# Patient Record
Sex: Female | Born: 1935 | Race: Black or African American | Hispanic: No | Smoking: Never smoker
Health system: Southern US, Community
[De-identification: ages and names within clinical notes are randomized; demographics above are authoritative.]

## PROBLEM LIST (undated history)

## (undated) DIAGNOSIS — G309 Alzheimer's disease, unspecified: Secondary | ICD-10-CM

## (undated) DIAGNOSIS — N289 Disorder of kidney and ureter, unspecified: Secondary | ICD-10-CM

## (undated) DIAGNOSIS — M1990 Unspecified osteoarthritis, unspecified site: Secondary | ICD-10-CM

## (undated) DIAGNOSIS — I10 Essential (primary) hypertension: Secondary | ICD-10-CM

## (undated) DIAGNOSIS — R296 Repeated falls: Secondary | ICD-10-CM

## (undated) DIAGNOSIS — R569 Unspecified convulsions: Secondary | ICD-10-CM

## (undated) DIAGNOSIS — F0280 Dementia in other diseases classified elsewhere without behavioral disturbance: Secondary | ICD-10-CM

## (undated) HISTORY — DX: Unspecified convulsions: R56.9

## (undated) HISTORY — DX: Disorder of kidney and ureter, unspecified: N28.9

## (undated) HISTORY — DX: Essential (primary) hypertension: I10

## (undated) HISTORY — DX: Repeated falls: R29.6

## (undated) HISTORY — DX: Unspecified osteoarthritis, unspecified site: M19.90

## (undated) HISTORY — PX: TUBAL LIGATION: SHX77

---

## 2005-10-15 LAB — HEPATIC FUNCTION PANEL
ALK PHOS: 78 U/L (ref 25–125)
ALT: 43 U/L — AB (ref 7–35)
AST: 36 U/L — AB (ref 13–35)
Bilirubin, Total: 0.3 mg/dL

## 2005-10-15 LAB — BASIC METABOLIC PANEL
BUN: 10 mg/dL (ref 4–21)
Creatinine: 0.3 mg/dL — AB (ref 0.5–1.1)
Glucose: 92 mg/dL
POTASSIUM: 4.6 mmol/L (ref 3.4–5.3)
SODIUM: 140 mmol/L (ref 137–147)

## 2014-01-03 DIAGNOSIS — E785 Hyperlipidemia, unspecified: Secondary | ICD-10-CM | POA: Insufficient documentation

## 2014-02-06 ENCOUNTER — Emergency Department: Payer: Self-pay | Admitting: Emergency Medicine

## 2014-02-06 LAB — COMPREHENSIVE METABOLIC PANEL
ALBUMIN: 2.7 g/dL — AB (ref 3.4–5.0)
ALK PHOS: 80 U/L
AST: 45 U/L — AB (ref 15–37)
Anion Gap: 5 — ABNORMAL LOW (ref 7–16)
BUN: 15 mg/dL (ref 7–18)
Bilirubin,Total: 0.3 mg/dL (ref 0.2–1.0)
Calcium, Total: 9.3 mg/dL (ref 8.5–10.1)
Chloride: 109 mmol/L — ABNORMAL HIGH (ref 98–107)
Co2: 31 mmol/L (ref 21–32)
Creatinine: 1.07 mg/dL (ref 0.60–1.30)
EGFR (Non-African Amer.): 51 — ABNORMAL LOW
Glucose: 84 mg/dL (ref 65–99)
Osmolality: 289 (ref 275–301)
Potassium: 3.2 mmol/L — ABNORMAL LOW (ref 3.5–5.1)
SGPT (ALT): 30 U/L
Sodium: 145 mmol/L (ref 136–145)
Total Protein: 6.7 g/dL (ref 6.4–8.2)

## 2014-02-06 LAB — CBC
HCT: 36.2 % (ref 35.0–47.0)
HGB: 11.2 g/dL — AB (ref 12.0–16.0)
MCH: 26.6 pg (ref 26.0–34.0)
MCHC: 31 g/dL — AB (ref 32.0–36.0)
MCV: 86 fL (ref 80–100)
Platelet: 252 10*3/uL (ref 150–440)
RBC: 4.21 10*6/uL (ref 3.80–5.20)
RDW: 16.7 % — AB (ref 11.5–14.5)
WBC: 8.1 10*3/uL (ref 3.6–11.0)

## 2014-02-06 LAB — URINALYSIS, COMPLETE
Bacteria: NONE SEEN
Bilirubin,UR: NEGATIVE
GLUCOSE, UR: NEGATIVE mg/dL (ref 0–75)
KETONE: NEGATIVE
NITRITE: NEGATIVE
Ph: 7 (ref 4.5–8.0)
Protein: NEGATIVE
RBC,UR: 64 /HPF (ref 0–5)
SPECIFIC GRAVITY: 1.008 (ref 1.003–1.030)
Squamous Epithelial: <1

## 2014-02-06 LAB — TROPONIN I: TROPONIN-I: 0.07 ng/mL — AB

## 2014-02-07 ENCOUNTER — Observation Stay: Payer: Self-pay | Admitting: Internal Medicine

## 2014-02-07 DIAGNOSIS — I4891 Unspecified atrial fibrillation: Secondary | ICD-10-CM

## 2014-02-07 DIAGNOSIS — R55 Syncope and collapse: Secondary | ICD-10-CM

## 2014-02-07 DIAGNOSIS — I10 Essential (primary) hypertension: Secondary | ICD-10-CM

## 2014-02-07 LAB — MAGNESIUM: Magnesium: 2 mg/dL

## 2014-02-07 LAB — COMPREHENSIVE METABOLIC PANEL
ALBUMIN: 2.7 g/dL — AB (ref 3.4–5.0)
ALT: 34 U/L
Alkaline Phosphatase: 69 U/L
Anion Gap: 6 — ABNORMAL LOW (ref 7–16)
BUN: 12 mg/dL (ref 7–18)
Bilirubin,Total: 0.4 mg/dL (ref 0.2–1.0)
CO2: 29 mmol/L (ref 21–32)
Calcium, Total: 8.9 mg/dL (ref 8.5–10.1)
Chloride: 106 mmol/L (ref 98–107)
Creatinine: 1.01 mg/dL (ref 0.60–1.30)
EGFR (African American): >60
EGFR (Non-African Amer.): 55 — ABNORMAL LOW
GLUCOSE: 98 mg/dL (ref 65–99)
Osmolality: 281 (ref 275–301)
Potassium: 3.3 mmol/L — ABNORMAL LOW (ref 3.5–5.1)
SGOT(AST): 42 U/L — ABNORMAL HIGH (ref 15–37)
SODIUM: 141 mmol/L (ref 136–145)
Total Protein: 6.5 g/dL (ref 6.4–8.2)

## 2014-02-07 LAB — CBC WITH DIFFERENTIAL/PLATELET
BASOS ABS: 0 10*3/uL (ref 0.0–0.1)
Basophil %: 0.6 %
EOS PCT: 1.6 %
Eosinophil #: 0.1 10*3/uL (ref 0.0–0.7)
HCT: 34.9 % — AB (ref 35.0–47.0)
HGB: 11.1 g/dL — ABNORMAL LOW (ref 12.0–16.0)
LYMPHS PCT: 22.7 %
Lymphocyte #: 1.7 10*3/uL (ref 1.0–3.6)
MCH: 27.2 pg (ref 26.0–34.0)
MCHC: 31.7 g/dL — AB (ref 32.0–36.0)
MCV: 86 fL (ref 80–100)
Monocyte #: 0.7 x10 3/mm (ref 0.2–0.9)
Monocyte %: 8.7 %
Neutrophil #: 5 10*3/uL (ref 1.4–6.5)
Neutrophil %: 66.4 %
Platelet: 170 10*3/uL (ref 150–440)
RBC: 4.07 10*6/uL (ref 3.80–5.20)
RDW: 16.8 % — AB (ref 11.5–14.5)
WBC: 7.5 10*3/uL (ref 3.6–11.0)

## 2014-02-07 LAB — TROPONIN I
Troponin-I: 0.06 ng/mL — ABNORMAL HIGH
Troponin-I: 0.05 ng/mL
Troponin-I: 0.05 ng/mL

## 2014-02-07 LAB — TSH: THYROID STIMULATING HORM: 3.42 u[IU]/mL

## 2014-02-08 DIAGNOSIS — I340 Nonrheumatic mitral (valve) insufficiency: Secondary | ICD-10-CM

## 2014-02-08 DIAGNOSIS — I4891 Unspecified atrial fibrillation: Secondary | ICD-10-CM

## 2014-02-08 DIAGNOSIS — R7989 Other specified abnormal findings of blood chemistry: Secondary | ICD-10-CM

## 2014-02-08 DIAGNOSIS — R55 Syncope and collapse: Secondary | ICD-10-CM

## 2014-02-08 LAB — BASIC METABOLIC PANEL
Anion Gap: 5 — ABNORMAL LOW (ref 7–16)
BUN: 10 mg/dL (ref 7–18)
CHLORIDE: 113 mmol/L — AB (ref 98–107)
CO2: 28 mmol/L (ref 21–32)
Calcium, Total: 8.4 mg/dL — ABNORMAL LOW (ref 8.5–10.1)
Creatinine: 0.89 mg/dL (ref 0.60–1.30)
EGFR (African American): >60
GLUCOSE: 82 mg/dL (ref 65–99)
OSMOLALITY: 289 (ref 275–301)
POTASSIUM: 4.2 mmol/L (ref 3.5–5.1)
Sodium: 146 mmol/L — ABNORMAL HIGH (ref 136–145)

## 2014-06-10 NOTE — Consult Note (Signed)
General Aspect Primary Cardiologist: New to [facility] ________________  81 year old female with history of Alzheimer's, anorexia, HTN, dementia, and diverticulosis who presented to [facility] on 02/06/14 after suffering a presyncopal/syncopal episode at the nurising home. Upon her arrival to the ED she was found to be in new onset a-fib with heart rate in the 60s. Troponin was found to be 0.07 with subsequent levels 0.06-->0.05. Head CT negative for acute intracranial abnormality. Cardiology was consulted for further evaluation of her new onset a-fib and presyncopal/syncopal episode.  _______________   PMH: 1. Alzehimer's disease 2. Anorexia 3. HTN 4. Dementia 5. Diverticulosis _______________   Present Illness 81 year old female with the above problem list who presented to [facility] on 12/21 with complaints after sustaining a presyncopal/syncopal episode at her nurising home.   Patient's history is taken from her family member's (2 daughters and 1 son in the room).  She is without any previously known cardiac history. No previous echos, cardiac stress tests, or catheterizations. She does not have a cardiologist. She was recently diagnosed with a "severe UTI" and is finishing up treatment for this with antibiotics. She has also been diagnosed with anorexia and has lost a considerable amount of weight. She has been suffering an increased number of falls lately and these seem to be increasing in frequency.   At her nursing home on 12/21 she was sitting in a chair and slumped over all of the sudden. Her children were not told of any slurred speech or facial droop. The patient was unable to stand at that time and it took 2 workers to carry her to a bed for further evaluation. Her children are unable to tell me how long these symptoms lasted. She has never had an episode like this before. The patient was brought to [facility] for further evaluation. Upon her arrival to [facility] she was found to be in rate controlled a-fib  with a rate in the 60s. Apparently, this is a new diagnosis for the patient as her children are not aware of the patient being diagnosed with a-fib previously. She remained in a-fib, rate controlled, until arriving on the floor when she converted to sinus rhythm with frequent PACs and blocked PACs. Labs were significant for K+ 3.2-->3.3, TSH 3.42, troponin 0.07-->0.06-->0.05, hgb 11.2, CT as above, CXR with hypoinflation without acute cardiopulmonary disease, UA with 2+ blood and trace LE. She is currently resting comfortably in the bed with headphones on.   Physical Exam:  GEN well developed, no acute distress, thin   HEENT PERRL, HOH   NECK supple   RESP normal resp effort  clear BS   CARD Irregular rate and rhythm  Normal, S1, S2  No murmur   ABD denies tenderness  soft   LYMPH negative neck   EXTR negative edema   SKIN normal to palpation   NEURO motor/sensory function intact   PSYCH alert   Review of Systems:  Subjective/Chief Complaint SOB   General: Fatigue  Weakness   Skin: No Complaints   ENT: Decreased hearing   Eyes: No Complaints   Neck: No Complaints   Respiratory: Short of breath   Cardiovascular: Palpitations  Dyspnea   Gastrointestinal: No Complaints   Genitourinary: No Complaints   Vascular: No Complaints   Musculoskeletal: No Complaints   Neurologic: Dizzness  Fainting   Hematologic: No Complaints   Endocrine: No Complaints   Psychiatric: No Complaints   Review of Systems: All other systems were reviewed and found to be  negative   Family & Social History:  Family and Social History:  Family History Negative  unable to obtain 2/2 dementia   Social History negative tobacco, negative ETOH, negative Illicit drugs   Place of Living Nursing Home     Alzheimer's Disease:    alz:    diverticulitis:    hypertension:          Admit Diagnosis:   SYNCOPE: Onset Date: 07-Feb-2014, Status: Active, Description: SYNCOPE  Home  Medications: Medication Instructions Status  ciprofloxacin 500 mg oral tablet 1 tab(s) orally every 12 hours Active  guaiFENesin 100 mg/5 mL oral liquid 10 milliliter(s) orally every 6 hours, As Needed Active  loperamide 2 mg oral tablet 1 tab(s) orally , As Needed after each loose stool. maximum 4 doses per day Active  Pepto-Bismol 525 mg/15 mL oral suspension 15 milliliter(s) orally every 2 hours, As Needed (max of 6 doses in 24 hours) Active  Milk of Magnesia 8% oral suspension 30 milliliter(s) orally once a day (at bedtime), As Needed Active  acetaminophen 325 mg oral tablet 2 tab(s) orally every 4 hours, As Needed Active  Mylanta Maximum Strength 400 mg-400 mg-40 mg/5 mL oral suspension 30 milliliter(s) orally 4 times a day (before meals and at bedtime), As Needed Active  Aspirin Enteric Coated 81 mg oral delayed release tablet 1 tab(s) orally once a day Active  donepezil 10 mg oral tablet 1 tab(s) orally once a day (at bedtime) Active  losartan 50 mg oral tablet 1 tab(s) orally once a day Active  multivitamin 1 tab(s) orally once a day Active  Vitamin D3 5000 intl units oral tablet 1 tab(s) orally once a day Active  mirtazapine 15 mg oral tablet 1 tab(s) orally once a day (at bedtime) Active   Lab Results:  Thyroid:  22-Dec-15 08:59   Thyroid Stimulating Hormone 3.42 (0.45-4.50 (IU = International Unit)  ----------------------- Pregnant patients have  different reference  ranges for TSH:  - - - - - - - - - -  Pregnant, first trimetser:  0.36 - 2.50 uIU/mL)  Hepatic:  22-Dec-15 08:52   Bilirubin, Total 0.4  Alkaline Phosphatase 69 (46-116 NOTE: New Reference Range 09/06/13)  SGPT (ALT) 34 (14-63 NOTE: New Reference Range 09/06/13)  SGOT (AST)  42  Total Protein, Serum 6.5  Albumin, Serum  2.7  Routine Chem:  22-Dec-15 08:52   Result Comment PLATELETS - SLIGHT PLATELET CLUMPING IN SPECIMEN. ACTUAL  - NUMERICAL COUNT MAY BE SOMEWHAT HIGHER THAN  - THE REPORTED VALUE.   Result(s) reported on 07 Feb 2014 at 10:05AM.  Glucose, Serum 98  BUN 12  Creatinine (comp) 1.01  Sodium, Serum 141  Potassium, Serum  3.3  Chloride, Serum 106  CO2, Serum 29  Calcium (Total), Serum 8.9  Osmolality (calc) 281  eGFR (African American) >60  eGFR (Non-African American)  55 (eGFR values <2m/min/1.73 m2 may be an indication of chronic kidney disease (CKD). Calculated eGFR, using the MRDR Study equation, is useful in  patients with stable renal function. The eGFR calculation will not be reliable in acutely ill patients when serum creatinine is changing rapidly. It is not useful in patients on dialysis. The eGFR calculation may not be applicable to patients at the low and high extremes of body sizes, pregnant women, and vegetarians.)  Anion Gap  6  Cardiac:  22-Dec-15 08:52   Troponin I  0.06 (0.00-0.05 0.05 ng/mL or less: NEGATIVE  Repeat testing in 3-6 hrs  if clinically indicated. >0.05 ng/mL:  POTENTIAL  MYOCARDIAL INJURY. Repeat  testing in 3-6 hrs if  clinically indicated. NOTE: An increase or decrease  of 30% or more on serial  testing suggests a  clinically important change)    13:20   Troponin I 0.05 (0.00-0.05 0.05 ng/mL or less: NEGATIVE  Repeat testing in 3-6 hrs  if clinically indicated. >0.05 ng/mL: POTENTIAL  MYOCARDIAL INJURY. Repeat  testing in 3-6 hrs if  clinically indicated. NOTE: An increase or decrease  of 30% or more on serial  testing suggests a  clinically important change)  Routine Hem:  22-Dec-15 08:52   WBC (CBC) 7.5  RBC (CBC) 4.07  Hemoglobin (CBC)  11.1  Hematocrit (CBC)  34.9  Platelet Count (CBC) 170  MCV 86  MCH 27.2  MCHC  31.7  RDW  16.8  Neutrophil % 66.4  Lymphocyte % 22.7  Monocyte % 8.7  Eosinophil % 1.6  Basophil % 0.6  Neutrophil # 5.0  Lymphocyte # 1.7  Monocyte # 0.7  Eosinophil # 0.1  Basophil # 0.0   EKG:  EKG Interp. by me   Interpretation EKG shows a-fib, 67 bpm, left anterior  fascicular block, no st/t changes   Radiology Results:  XRay:    22-Dec-15 09:12, Chest PA and Lateral  Chest PA and Lateral   REASON FOR EXAM:    fall  COMMENTS:       PROCEDURE: DXR - DXR CHEST PA (OR AP) AND LATERAL  - Feb 07 2014  9:12AM     CLINICAL DATA:  Unwitnessed fall this morning. Patient nonverbal and  unable to communicate. Alzheimer's disease.    EXAM:  CHEST  2 VIEW    COMPARISON:  None.    FINDINGS:  Lungs are hypoinflated but otherwise clear. There is mild  cardiomegaly as well as mild calcified plaque over the aortic arch.  There are mild to moderate degenerative changes of the spine with  mild curvature of the thoracic spine convex right. There is mild  anterior wedging of a couple upper thoracic vertebral bodies.     IMPRESSION:  Hypoinflation without acute cardiopulmonary disease.    Mild anterior wedging of a couple upper thoracic vertebral bodies  likely chronic.      Electronically Signed    By: Marin Olp M.D.    On: 02/07/2014 09:19     Verified By: Pearletha Alfred, M.D.,    No Known Allergies:   Vital Signs/Nurse's Notes: **Vital Signs.:   22-Dec-15 14:46  Vital Signs Type Admission  Temperature Temperature (F) 98.2  Celsius 36.7  Temperature Source oral  Pulse Pulse 63  Respirations Respirations 20  Systolic BP Systolic BP 371  Diastolic BP (mmHg) Diastolic BP (mmHg) 82  Mean BP 117  Pulse Ox % Pulse Ox % 97  Pulse Ox Activity Level  At rest  Oxygen Delivery Room Air/ 21 %    Impression 81 year old female with history of Alzheimer's, anorexia, HTN, dementia, and diverticulosis who presented to [facility] on 02/06/14 after suffering a presyncopal/syncopal episode at the nurising home. Upon her arrival to the ED she was found to be in new onset a-fib with heart rate in the 60s. Troponin was found to be 0.07 with subsequent levels 0.06-->0.05. Head CT negative for acute intracranial abnormality. Cardiology was consulted for further  evaluation of her new onset a-fib and presyncopal/syncopal episode.   1. New onset a-fib: -Unclear when she went into this rhythm - possible precipitating event could be recent UTI 1  week ago -Rate controlled upon admission with rates in the 60s -Telemetry currently shows sinus rhythm with PACs and blocked PACs with rates in the mid 60s -She has history of falls and has been falling more recently, given this would hold anticoagulation at this time -CHADSVASc at least 4, giving her a 4.0% annual risk of stroke -Check Mg -K+ 3.3 -->replete to goal of 4.0 -TSH ok -Add low dose Lopressor 12.5 mg bid with hold paremeters will work on BP control  2. Presyncopal/syncopal event: -Possible TIA 2/2 the above a-fib -Head CT negative for acute intracranial abnormality Consider MRI brain -Check echo to evaluate LV function and wall motion -Telemetry as above, continue to monitor on tele -If telemetry is unremarkable while inpatient could pursue outpatient cardiac monitoring if family wishes  3. Hypokalemia: -Replete to goal of 4.0  4. Elevated troponin: -Likely demand ischemia in the setting of her new onset a-fib -Mildly elevated troponin with a peak of 0.07 that trended down to 0.06-->0.05 -Echo as above no stress test at this time  4. HTN: -Uncontrolled (189/82) -Losartan 50 mg -Lopressor added per above add amlodipine  5. Dementia  6. Anorexia  7. History of UTI: -UA without evidence of infection at this time   Electronic Signatures: Rise Mu (PA-C)  (Signed 22-Dec-15 18:08)  Authored: General Aspect/Present Illness, History and Physical Exam, Review of System, Family & Social History, Past Medical History, Home Medications, Labs, EKG , Radiology, Allergies, Vital Signs/Nurse's Notes, Impression/Plan Ida Rogue (MD)  (Signed 22-Dec-15 19:53)  Authored: General Aspect/Present Illness, History and Physical Exam, Review of System, Family & Social History, Health Issues,  EKG , Radiology, Vital Signs/Nurse's Notes, Impression/Plan  Co-Signer: General Aspect/Present Illness, Home Medications, Allergies   Last Updated: 22-Dec-15 19:53 by Ida Rogue (MD)

## 2014-06-10 NOTE — H&P (Signed)
PATIENT NAME:  Mary [name], Mary [name] MR#:  [id number] DATE OF BIRTH:  [birth year]  DATE OF ADMISSION:  02/07/2014   CHIEF COMPLAINT: Fall.   HISTORY OF PRESENT ILLNESS: A 81 year old [place] female patient with Alzheimer dementia, hypertension, recurrent falls, presents to the hospital after she was found to be fallen on the floor.  It is unclear if the patient had any syncope. The patient with Alzheimer's dementia is a poor historian and presently she has no concerns and is very pleasant.   The patient has been found to have elevated troponin of 0.07 along with new onset atrial fibrillation on her EKG and is being admitted to the hospitalist service for further work-up and treatment.   History has been obtained from old records, ER staff, and talking to the family.   The patient is a resident of a care home. He has a 24-hour caregiver with her.  She tends to ambulate on her own, but has had recurrent falls over a long time due to balance problems. Today the fall was unwitnessed. The patient had a CT scan of the head and neck, which showed some arthritis, nothing acute.   PAST MEDICAL HISTORY:  1.  Alzheimer's dementia.  2.  Hypertension.  3.  Recurrent falls.   SOCIAL HISTORY: The patient does not smoke. No alcohol. No illicit drug use.   CODE STATUS: FULL CODE.   FAMILY HISTORY: Reviewed, but unknown.   REVIEW OF SYSTEMS: Unobtainable secondary to the patient's dementia.   ALLERGIES: No drug allergies.   HOME MEDICATIONS:  1.  Losartan 50 mg daily.  2.  Aspirin 81 mg daily. 3.  Acetaminophen 325, 2 tablets every 6 hours as needed.  4.  Ciprofloxacin 5 mg every 12 hours, started recently for urinary tract infection.  6.  Loperamide 2 mg oral as needed for diarrhea.  7.  Remeron 15 mg oral once a day.  8.  Multivitamin 1 tablet daily.  9.  Mylanta 30 mL oral 4 times a day as needed.  10. Vitamin D3 5000 international units once a day.   PHYSICAL EXAMINATION:  VITAL SIGNS:  Temperature 97.6, pulse of 64 and regular, blood pressure 155/72 and saturating 97% on room air.  GENERAL: Frail, elderly African American female patient lying in bed, overall seems comfortable.  PSYCHIATRIC:  Alert, awake, not oriented to place or time, oriented to person. Pleasant. HEENT:  Atraumatic, normocephalic.  Oral mucosa moist and pink. External ears and nose normal. No pallor or icterus. Pupils are equal and reactive to light.  NECK: Supple. No thyromegaly.  No palpable lymph nodes.  Trachea midline. No carotid bruit, JVD.  CARDIOVASCULAR: S1, S2, irregular. No murmurs. Peripheral pulses 2+. No edema.  RESPIRATORY: Normal work of breathing. Clear to auscultation on both sides.  GASTROINTESTINAL: Soft abdomen, nontender, bowel sounds present,no organomegaly palpable.  SKIN: Warm and dry, no petechiae, rash, ulcers.  MUSCULOSKELETAL: No joint swelling, redness, effusion of the large joints. Normal muscle tone.  NEUROLOGICAL:  5/5 in upper and lower extremities, sensation is intact all over,  LYMPHATICS:  No cervical or inguinal lymphadenopathy.  LABORATORY DATA:  Glucose 98, BUN 12, creatinine 1.01, potassium 3.3, AST, ALT, and bilirubin normal. Troponin 0.06, hemoglobin 11, platelets of 170,000.  WBC 7.5.  EKG shows atrial fibrillation. No acute ST-T wave changes found.   CT scan of the head and neck showed no acute fractures. Advance lower cervical and cervical thoracic junction degenerative changes.   Chest x-ray PA and lateral, shows anterior  wedging of a couple upper thoracic vertebral bodies, likely chronic.  Hyperinflation without acute cardiopulmonary disease.   ASSESSMENT AND PLAN:  1.  Syncope. It is unclear if the patient had a syncope, but did have unwitnessed fall, which seems to be a recurrent problem for her.  But considering new onset atrial fibrillation and mildly elevated troponin, the patient is being admitted for further work-up and will be placed on telemetry.   Presently, her rate is controlled with the atrial fibrillation. We get an echocardiogram.  With the recurrent falls, she would not be a candidate for anticoagulation. We will continue the aspirin, consult cardiology, and check a TSH level.  Also check 2 more sets of troponin. The troponin seems to be stable from yesterday when she was seen in the Emergency Room for another fall. We need to monitor if the patient is having any tachy-brady episodes causing her syncope.  2.  Urinary tract infection. The patient has been on ciprofloxacin as an outpatient, which will be continued.  3.  Hypertension. Continue Losartan.  4.  Dementia. Watch for any inpatient delirium.  5.  Hypokalemia. Replace orally.   CODE STATUS: DNR/DNI.   TIME SPENT TODAY ON THIS CASE: 45 minutes.      ____________________________ Molinda Bailiff Nazia Rhines, MD srs:DT D: 02/07/2014 11:49:50 ET T: 02/07/2014 12:14:30 ET JOB#: [id number]  cc: Wardell Heath R. Mayah Urquidi, MD, <Dictator> Orie Fisherman MD ELECTRONICALLY SIGNED 02/07/2014 20:56

## 2014-06-14 NOTE — Discharge Summary (Signed)
PATIENT NAMCelesta Gentile:  [name], Mary [name] MR#:  [id number] DATE OF BIRTH:  03-07-1921  DATE OF ADMISSION:  02/07/2014 DATE OF DISCHARGE:  02/08/2014  ADMITTING DIAGNOSES:  1.  Fall. 2.  Possible syncope.   DISCHARGE DIAGNOSES:  1.  Fall  related to gait instability. It is unlikely syncope that according to the daughter, she does not think the patient fell, recurrent falls again, has gait instability and needs for further rehabilitation and therapy at this assisted living, which is currently being arranged.  2.  Recent urinary tract infection without evidence of urinary tract infection.  3.  Alzheimer dementia.  4.  Hypertension.  5.  Atrial fibrillation noted during hospitalization. The patient CHADs score is 4, however, due to her recurrent falls, is felt to be too high risk for any type of anticoagulation, seen by cardiology.  6.  Hypokalemia repleted.  7.  Elevated troponin, felt to be due to demand ischemia. Hypertension accelerated on arrival blood pressure medications adjusted.  8.  Dementia.  9.  Anorexia.  CONSULTANTS:  Dr. Mariah MillingGollan.   PERTINENT LABORATORY, EVALUATIONS AND TREATMENTS:  Glucose 98, BUN 12, creatinine 1.01, sodium 141, potassium 3.3, chloride 106, CO2 29, calcium 8.9. LFTs are normal except albumin at 2.7, AST is 42. Troponin 0.06 and 0.05. WBC 7.5, hemoglobin 11.1, platelet count was 170. Echocardiogram showed left ejection fraction 65%, mild mitral valve regurgitation, mild to moderate tricuspid regurgitation, mildly elevated pulmonary artery systolic pressures.   HOSPITAL COURSE: Please refer to H and P done by the admitting physician. The patient is a 81 year old [place] female, who currently resides in assisted living brought in for a fall, possible syncope. However, the daughter feels that this was not syncope. She was evaluated and noted to be in atrial fibrillation. A cardiology consult was obtained. They did not feel that she would be a good anticoagulation candidate.  She was monitored on telemetry overnight. The patient was seen by, again, physical therapy. They recommended further continuing PT. This has been reordered. At this time, she is doing better and is stable for discharge.   DISCHARGE MEDICATIONS: Milk of magnesia 30 mL at bedtime, Tylenol 650 q.4 hours p.r.n. for pain, Mylanta 30 mL 4 times a day as needed, aspirin 81 mg 1 tab p.o. daily, donepezil 10 mg daily, losartan 50 mg daily, multivitamin 1 tab p.o. daily, mirtazapine 15 mg at bedtime, vitamin D3 5000 international units daily, loperamide 2 mg as needed, Pepto-Bismol 15 mL q. 2 hours every 12 hours as needed, metoprolol tartrate 12.5 mg every 12 hours.   HOME HEALTH SERVICES: Physical therapy and nurse.   DIET: Low-sodium, low-fat, low-cholesterol.   ACTIVITY: As tolerated. PT evaluation and treatment. Follow with primary M.D. in 1 to 2 weeks.   TIME SPENT: 35 minutes. ____________________________ Lacie ScottsShreyang H. Allena KatzPatel, MD shp:at D: 02/08/2014 15:10:50 ET T: 02/08/2014 15:58:15 ET JOB#: [id number]  cc: Jiya Kissinger H. Allena KatzPatel, MD, <Dictator> Charise CarwinSHREYANG H Awad Gladd MD ELECTRONICALLY SIGNED 02/19/2014 12:17

## 2014-07-28 ENCOUNTER — Telehealth: Payer: Self-pay | Admitting: Family Medicine

## 2014-07-28 NOTE — Telephone Encounter (Signed)
Refill Request.  

## 2014-07-28 NOTE — Telephone Encounter (Signed)
[facility] called stated pt needs refill on Losartan. Pharm is [facility]. Thanks

## 2014-07-31 MED ORDER — LOSARTAN POTASSIUM 50 MG PO TABS: 50.0000 mg | ORAL_TABLET | Freq: Every day | ORAL | Status: DC

## 2014-07-31 NOTE — Addendum Note (Signed)
Addended by: Dorcas Carrow on: 07/31/2014 08:31 AM   Modules accepted: Orders

## 2014-08-10 ENCOUNTER — Other Ambulatory Visit: Payer: Self-pay | Admitting: Family Medicine

## 2014-08-15 ENCOUNTER — Ambulatory Visit (INDEPENDENT_AMBULATORY_CARE_PROVIDER_SITE_OTHER): Payer: Medicare Other | Admitting: Family Medicine

## 2014-08-15 ENCOUNTER — Encounter: Payer: Self-pay | Admitting: Family Medicine

## 2014-08-15 VITALS — BP 136/60 | HR 67 | Temp 97.9°F | Ht 59.5 in | Wt 106.2 lb

## 2014-08-15 DIAGNOSIS — I129 Hypertensive chronic kidney disease with stage 1 through stage 4 chronic kidney disease, or unspecified chronic kidney disease: Secondary | ICD-10-CM

## 2014-08-15 DIAGNOSIS — R7989 Other specified abnormal findings of blood chemistry: Secondary | ICD-10-CM | POA: Diagnosis not present

## 2014-08-15 DIAGNOSIS — Z87440 Personal history of urinary (tract) infections: Secondary | ICD-10-CM | POA: Diagnosis not present

## 2014-08-15 DIAGNOSIS — F0390 Unspecified dementia without behavioral disturbance: Secondary | ICD-10-CM | POA: Insufficient documentation

## 2014-08-15 DIAGNOSIS — G4700 Insomnia, unspecified: Secondary | ICD-10-CM | POA: Diagnosis not present

## 2014-08-15 DIAGNOSIS — F0391 Unspecified dementia with behavioral disturbance: Secondary | ICD-10-CM | POA: Diagnosis not present

## 2014-08-15 DIAGNOSIS — R945 Abnormal results of liver function studies: Secondary | ICD-10-CM

## 2014-08-15 LAB — MICROSCOPIC EXAMINATION

## 2014-08-15 NOTE — Assessment & Plan Note (Signed)
Blood pressure is very labile and goes up and down a great deal. We will keep her on her current regimen, as we are more concerned about falls than we are about the elevated blood pressure at this time. Continue current regimen, continue to monitor at home.

## 2014-08-15 NOTE — Assessment & Plan Note (Signed)
Greatly influenced by her dementia and her sundowning. Stable on her current regimen. Continue current regimen. Continue to monitor.

## 2014-08-15 NOTE — Assessment & Plan Note (Signed)
Her diease is progressing and she is now wandering. Her rest home is no longer able to care for her to the degree that they feel is safe. They are recommending increased level of care with the potential to pass on to a locked unit. We are in full agreement with this. Will discuss with her daughter at her next appointment over the next couple of weeks.

## 2014-08-15 NOTE — Progress Notes (Signed)
BP 136/60 mmHg  Pulse 67  Temp(Src) 97.9 F (36.6 C) (Oral)  Ht 4' 11.5" (1.511 m)  Wt 106 lb 3.2 oz (48.172 kg)  BMI 21.10 kg/m2  SpO2 99%   Subjective:    Patient ID: Mary [name], female    DOB: 1921/08/27, 81 y.o.   MRN: [id number]  HPI: Mary [name] is a 81 y.o. female  Chief Complaint  Patient presents with  . Follow-up   Had a fall in the yard- got out without anyone knowing. Still wandering. Caregivers are concerned that she is wandering and having trouble with her eating. They are thinking that she may need a higher level of care for her alzheimer's. Her family still doesn't understand her disease.   Dementia- slightly worsening. Has been wandering more, refusing to eat. Caregivers concerned that she is not on a locked unit and may suffer an injury. Continuing with sundowning, but medication is helping at night.   HYPERTENSION Hypertension status: stable Satisfied with current treatment? yes Duration of hypertension: chronic BP monitoring frequency:  weekly BP range: up and down BP medication side effects:  no Medication compliance: excellent compliance Aspirin: yes Recurrent headaches: no Visual changes: no Palpitations: no Dyspnea: no Chest pain: no Lower extremity edema: no Dizzy/lightheaded: no  INSOMNIA Duration: chronic Satisfied with sleep quality: yes Difficulty falling asleep: yes Difficulty staying asleep: yes Waking a few hours after sleep onset: yes Early morning awakenings: yes Daytime hypersomnolence: yes Wakes feeling refreshed: no Good sleep hygiene: yes Apnea: no Snoring: no Depressed/anxious mood: no Recent stress: no Restless legs/nocturnal leg cramps: no Chronic pain/arthritis: no History of sleep study: no Treatments attempted: melatonin, uinsom and benadryl    Relevant past medical, surgical, family and social history reviewed and updated as indicated. Interim medical history since our last visit reviewed. Allergies and  medications reviewed and updated.  Review of Systems  Constitutional: Negative.   Respiratory: Negative.   Cardiovascular: Negative.   Musculoskeletal: Negative.   Psychiatric/Behavioral: Positive for behavioral problems, confusion, sleep disturbance and decreased concentration. Negative for suicidal ideas, hallucinations, self-injury, dysphoric mood and agitation. The patient is not nervous/anxious and is not hyperactive.     Per HPI unless specifically indicated above     Objective:    BP 136/60 mmHg  Pulse 67  Temp(Src) 97.9 F (36.6 C) (Oral)  Ht 4' 11.5" (1.511 m)  Wt 106 lb 3.2 oz (48.172 kg)  BMI 21.10 kg/m2  SpO2 99%  Wt Readings from Last 3 Encounters:  08/15/14 106 lb 3.2 oz (48.172 kg)  05/22/14 107 lb (48.535 kg)    Physical Exam  Constitutional: She is oriented to person, place, and time. She appears well-developed and well-nourished. No distress.  HENT:  Head: Normocephalic and atraumatic.  Right Ear: Hearing normal.  Left Ear: Hearing normal.  Nose: Nose normal.  Eyes: Conjunctivae and lids are normal. Right eye exhibits no discharge. Left eye exhibits no discharge. No scleral icterus.  Cardiovascular: Normal rate, regular rhythm and normal heart sounds.  Exam reveals no gallop and no friction rub.   No murmur heard. Pulmonary/Chest: Effort normal and breath sounds normal. No respiratory distress. She has no wheezes. She has no rales. She exhibits no tenderness.  Musculoskeletal: Normal range of motion.  Neurological: She is alert and oriented to person, place, and time.  Skin: Skin is warm, dry and intact. No rash noted. No erythema. No pallor.  Psychiatric: She has a normal mood and affect. Her speech is normal and behavior is normal.  Judgment and thought content normal. Cognition and memory are normal.  Nursing note and vitals reviewed.   Results for orders placed or performed in visit on 08/15/14  Microscopic Examination  Result Value Ref Range    WBC, UA 0-5 0 -  5 /hpf   RBC, UA 0-2 0 -  2 /hpf   Epithelial Cells (non renal) 0-10 0 - 10 /hpf   Bacteria, UA Few None seen/Few  UA/M w/rflx Culture, Routine  Result Value Ref Range   Specific Gravity, UA 1.010 1.005 - 1.030   pH, UA 5.5 5.0 - 7.5   Color, UA Yellow Yellow   Appearance Ur Clear Clear   Leukocytes, UA 2+ (A) Negative   Protein, UA Negative Negative/Trace   Glucose, UA Negative Negative   Ketones, UA Negative Negative   RBC, UA Negative Negative   Bilirubin, UA Negative Negative   Urobilinogen, Ur 0.2 0.2 - 1.0 mg/dL   Nitrite, UA Negative Negative   Microscopic Examination See below:    Urinalysis Reflex Comment       Assessment & Plan:   Problem List Items Addressed This Visit      Nervous and Auditory   Dementia - Primary    Her diease is progressing and she is now wandering. Her rest home is no longer able to care for her to the degree that they feel is safe. They are recommending increased level of care with the potential to pass on to a locked unit. We are in full agreement with this. Will discuss with her daughter at her next appointment over the next couple of weeks.       Relevant Orders   UA/M w/rflx Culture, Routine (Completed)     Genitourinary   Benign hypertensive renal disease    Blood pressure is very labile and goes up and down a great deal. We will keep her on her current regimen, as we are more concerned about falls than we are about the elevated blood pressure at this time. Continue current regimen, continue to monitor at home.       Relevant Orders   Comprehensive metabolic panel     Other   Insomnia    Greatly influenced by her dementia and her sundowning. Stable on her current regimen. Continue current regimen. Continue to monitor.           Follow up plan: Return in about 4 weeks (around 09/12/2014) for Discuss level of care with daughter.

## 2014-08-16 ENCOUNTER — Encounter: Payer: Self-pay | Admitting: Family Medicine

## 2014-08-16 DIAGNOSIS — R945 Abnormal results of liver function studies: Secondary | ICD-10-CM

## 2014-08-16 DIAGNOSIS — R7989 Other specified abnormal findings of blood chemistry: Secondary | ICD-10-CM | POA: Insufficient documentation

## 2014-08-16 LAB — COMPREHENSIVE METABOLIC PANEL
ALT: 39 IU/L — ABNORMAL HIGH (ref 0–32)
AST: 49 IU/L — AB (ref 0–40)
Albumin/Globulin Ratio: 0.9 — ABNORMAL LOW (ref 1.1–2.5)
Albumin: 3 g/dL — ABNORMAL LOW (ref 3.2–4.6)
Alkaline Phosphatase: 100 IU/L (ref 39–117)
BUN/Creatinine Ratio: 16 (ref 11–26)
BUN: 12 mg/dL (ref 10–36)
Bilirubin Total: <0.2 mg/dL (ref 0.0–1.2)
CO2: 26 mmol/L (ref 18–29)
Calcium: 9.6 mg/dL (ref 8.7–10.3)
Creatinine, Ser: 0.73 mg/dL (ref 0.57–1.00)
GFR, EST AFRICAN AMERICAN: 83 mL/min/{1.73_m2} (ref >59)
GFR, EST NON AFRICAN AMERICAN: 72 mL/min/{1.73_m2} (ref >59)
Globulin, Total: 3.3 g/dL (ref 1.5–4.5)
Glucose: 60 mg/dL — ABNORMAL LOW (ref 65–99)
TOTAL PROTEIN: 6.3 g/dL (ref 6.0–8.5)

## 2014-08-17 LAB — UA/M W/RFLX CULTURE, ROUTINE

## 2014-09-18 ENCOUNTER — Other Ambulatory Visit: Payer: Self-pay | Admitting: Family Medicine

## 2014-09-18 NOTE — Telephone Encounter (Signed)
Your patient.  Thanks 

## 2014-09-19 ENCOUNTER — Ambulatory Visit: Payer: Medicare Other | Admitting: Family Medicine

## 2014-09-19 ENCOUNTER — Other Ambulatory Visit: Payer: Self-pay | Admitting: Family Medicine

## 2014-09-19 NOTE — Telephone Encounter (Signed)
Your patient.  Thanks 

## 2014-09-20 ENCOUNTER — Ambulatory Visit (INDEPENDENT_AMBULATORY_CARE_PROVIDER_SITE_OTHER): Payer: Medicare Other | Admitting: Family Medicine

## 2014-09-20 ENCOUNTER — Encounter: Payer: Self-pay | Admitting: Family Medicine

## 2014-09-20 VITALS — BP 140/80 | HR 81 | Temp 98.7°F | Wt 104.0 lb

## 2014-09-20 DIAGNOSIS — F0391 Unspecified dementia with behavioral disturbance: Secondary | ICD-10-CM

## 2014-09-20 DIAGNOSIS — I129 Hypertensive chronic kidney disease with stage 1 through stage 4 chronic kidney disease, or unspecified chronic kidney disease: Secondary | ICD-10-CM | POA: Diagnosis not present

## 2014-09-20 NOTE — Assessment & Plan Note (Signed)
Remains very labile. More concerned about falling at this time, so will hold on increasing medicine at this time. Continue to monitor closely.

## 2014-09-20 NOTE — Assessment & Plan Note (Signed)
Long discussion with caregiver, and both daughters today about her respite home's ability to care for her. Caregiver is not comfortable with the level of care they are able to provide at this time. Discussion with daughters about beginning to look for skilled nursing facility with step up unit for dementia care. Medicare booklet provided to family today. Referral to neurology made for medication adjustment as needed. They will keep [place] informed if patient enters SNF, otherwise we will check in in 1 month to see how she is doing.

## 2014-09-20 NOTE — Progress Notes (Signed)
BP 140/80 mmHg  Pulse 81  Temp(Src) 98.7 F (37.1 C)  Wt 104 lb (47.174 kg)  SpO2 98%   Subjective:    Patient ID: Mary [name], female    DOB: 12/10/1921, 81 y.o.   MRN: [id number]  HPI: Mary [name] is a 81 y.o. female  Chief Complaint  Patient presents with  . Dementia   Patient has not wandered again, but has been having more sundowning and still having a lot of difficulty with sleep, even with the trazodone and the remeron. Daughters have noticed a difference in her and notice that she seems to be getting worse. Physically doing well. Has been taking her medications with no issues. Care provider is concerned about her level of care and their ability to care for her at the respite home much longer. Otherwise has been doing well. Has been eating well. No other concerns or complaints at this time.    Relevant past medical, surgical, family and social history reviewed and updated as indicated. Interim medical history since our last visit reviewed. Allergies and medications reviewed and updated.  Review of Systems  Constitutional: Negative.   Respiratory: Negative.   Cardiovascular: Negative.   Psychiatric/Behavioral: Negative.    Per HPI unless specifically indicated above     Objective:    BP 140/80 mmHg  Pulse 81  Temp(Src) 98.7 F (37.1 C)  Wt 104 lb (47.174 kg)  SpO2 98%  Wt Readings from Last 3 Encounters:  09/20/14 104 lb (47.174 kg)  08/15/14 106 lb 3.2 oz (48.172 kg)  05/22/14 107 lb (48.535 kg)    Physical Exam  Constitutional: She is oriented to person, place, and time. She appears well-developed and well-nourished. No distress.  HENT:  Head: Normocephalic and atraumatic.  Right Ear: Hearing normal.  Left Ear: Hearing normal.  Nose: Nose normal.  Eyes: Conjunctivae and lids are normal. Right eye exhibits no discharge. Left eye exhibits no discharge. No scleral icterus.  Pulmonary/Chest: Effort normal. No respiratory distress.  Musculoskeletal: Normal  range of motion.  Neurological: She is alert and oriented to person, place, and time.  Skin: Skin is intact. No rash noted.  Psychiatric: She has a normal mood and affect. Her speech is normal and behavior is normal. Judgment and thought content normal. Cognition and memory are normal.    Results for orders placed or performed in visit on 08/15/14  Microscopic Examination  Result Value Ref Range   WBC, UA 0-5 0 -  5 /hpf   RBC, UA 0-2 0 -  2 /hpf   Epithelial Cells (non renal) 0-10 0 - 10 /hpf   Bacteria, UA Few None seen/Few  Comprehensive metabolic panel  Result Value Ref Range   Glucose 60 (L) 65 - 99 mg/dL   BUN 12 10 - 36 mg/dL   Creatinine, Ser 4.09 0.57 - 1.00 mg/dL   GFR calc non Af Amer 72 >59 mL/min/1.73   GFR calc Af Amer 83 >59 mL/min/1.73   BUN/Creatinine Ratio 16 11 - 26   CO2 26 18 - 29 mmol/L   Calcium 9.6 8.7 - 10.3 mg/dL   Total Protein 6.3 6.0 - 8.5 g/dL   Albumin 3.0 (L) 3.2 - 4.6 g/dL   Globulin, Total 3.3 1.5 - 4.5 g/dL   Albumin/Globulin Ratio 0.9 (L) 1.1 - 2.5   Bilirubin Total <0.2 0.0 - 1.2 mg/dL   Alkaline Phosphatase 100 39 - 117 IU/L   AST 49 (H) 0 - 40 IU/L   ALT 39 (H)  0 - 32 IU/L  UA/M w/rflx Culture, Routine  Result Value Ref Range   Urine Culture, Routine Final report    Result 1 Comment       Assessment & Plan:   Problem List Items Addressed This Visit      Nervous and Auditory   Dementia - Primary    Long discussion with caregiver, and both daughters today about her respite home's ability to care for her. Caregiver is not comfortable with the level of care they are able to provide at this time. Discussion with daughters about beginning to look for skilled nursing facility with step up unit for dementia care. Medicare booklet provided to family today. Referral to neurology made for medication adjustment as needed. They will keep [place] informed if patient enters SNF, otherwise we will check in in 1 month to see how she is doing.        Relevant Orders   Ambulatory referral to Neurology     Genitourinary   Benign hypertensive renal disease    Remains very labile. More concerned about falling at this time, so will hold on increasing medicine at this time. Continue to monitor closely.         More than 50% of this patient's 30 minute appointment was spent in discussion and counselling with the family.   Follow up plan: Return in about 4 weeks (around 10/18/2014).

## 2014-10-02 ENCOUNTER — Other Ambulatory Visit: Payer: Self-pay

## 2014-10-02 ENCOUNTER — Emergency Department
Admission: EM | Admit: 2014-10-02 | Discharge: 2014-10-03 | Disposition: A | Discharge status: Home / Self Care | Payer: Medicare Other | Attending: Emergency Medicine | Admitting: Emergency Medicine

## 2014-10-02 DIAGNOSIS — F0390 Unspecified dementia without behavioral disturbance: Secondary | ICD-10-CM | POA: Insufficient documentation

## 2014-10-02 DIAGNOSIS — R531 Weakness: Secondary | ICD-10-CM | POA: Insufficient documentation

## 2014-10-02 DIAGNOSIS — Z79899 Other long term (current) drug therapy: Secondary | ICD-10-CM | POA: Diagnosis not present

## 2014-10-02 DIAGNOSIS — R55 Syncope and collapse: Secondary | ICD-10-CM | POA: Diagnosis present

## 2014-10-02 DIAGNOSIS — Z7982 Long term (current) use of aspirin: Secondary | ICD-10-CM | POA: Diagnosis not present

## 2014-10-02 DIAGNOSIS — I10 Essential (primary) hypertension: Secondary | ICD-10-CM | POA: Diagnosis not present

## 2014-10-02 LAB — BASIC METABOLIC PANEL
Anion gap: 8 (ref 5–15)
BUN: 17 mg/dL (ref 6–20)
CALCIUM: 9.2 mg/dL (ref 8.9–10.3)
CO2: 28 mmol/L (ref 22–32)
CREATININE: 0.86 mg/dL (ref 0.44–1.00)
Chloride: 100 mmol/L — ABNORMAL LOW (ref 101–111)
GFR calc Af Amer: >60 mL/min (ref >60)
GFR, EST NON AFRICAN AMERICAN: 57 mL/min — AB (ref >60)
GLUCOSE: 121 mg/dL — AB (ref 65–99)
POTASSIUM: 4.3 mmol/L (ref 3.5–5.1)
SODIUM: 136 mmol/L (ref 135–145)

## 2014-10-02 LAB — CBC WITH DIFFERENTIAL/PLATELET
BASOS ABS: 0 10*3/uL (ref 0–0.1)
BASOS PCT: 1 %
EOS ABS: 0.3 10*3/uL (ref 0–0.7)
EOS PCT: 4 %
HCT: 35.3 % (ref 35.0–47.0)
Hemoglobin: 11.3 g/dL — ABNORMAL LOW (ref 12.0–16.0)
LYMPHS PCT: 26 %
Lymphs Abs: 1.5 10*3/uL (ref 1.0–3.6)
MCH: 27.3 pg (ref 26.0–34.0)
MCHC: 31.9 g/dL — AB (ref 32.0–36.0)
MCV: 85.3 fL (ref 80.0–100.0)
MONO ABS: 0.5 10*3/uL (ref 0.2–0.9)
Monocytes Relative: 9 %
Neutro Abs: 3.5 10*3/uL (ref 1.4–6.5)
Neutrophils Relative %: 60 %
Platelets: 202 10*3/uL (ref 150–440)
RBC: 4.14 MIL/uL (ref 3.80–5.20)
RDW: 14.5 % (ref 11.5–14.5)
WBC: 5.8 10*3/uL (ref 3.6–11.0)

## 2014-10-02 LAB — URINALYSIS COMPLETE WITH MICROSCOPIC (ARMC ONLY)
Bacteria, UA: NONE SEEN
Bilirubin Urine: NEGATIVE
Glucose, UA: NEGATIVE mg/dL
HGB URINE DIPSTICK: NEGATIVE
KETONES UR: NEGATIVE mg/dL
NITRITE: NEGATIVE
PH: 6 (ref 5.0–8.0)
PROTEIN: NEGATIVE mg/dL
Specific Gravity, Urine: 1.016 (ref 1.005–1.030)

## 2014-10-02 LAB — TROPONIN I: Troponin I: 0.03 ng/mL (ref <0.031)

## 2014-10-02 NOTE — ED Provider Notes (Signed)
[facility] Emergency Department Provider Note ____________________________________________  Time seen: Approximately 8:22 PM  I have reviewed the triage vital signs and the nursing notes.   HISTORY  Chief Complaint Slumped over on porch  HPI Mary [name] is a 81 y.o. female who has had gradually worsening dementia and presents today from family care home after she was sitting slumped over on the porch and did not respond to caregivers. Caregivers say she frequently sits slumped with her head over but when they tried to arouse her she did not respond until EMS got there. When EMS arrived patient began to respond per baseline and was acting per baseline when she arrived to the ER. Patient denied any complaints and stated she felt well. Caregivers state she has not had any recent fever, cough, vomiting, diarrhea or other illness.  Daughter has been working with patient's physician to have her placed in an advanced memory care unit but does feel she is safe in the care home she has at while they are undergoing this process.  Her physicians have been closely watching her blood pressure which is frequently high however they are concerned increasing her medications could result in falls that they have been hesitant to do so.  Past Medical History  Diagnosis Date  . Arthritis   . Hypertension   . Kidney disease   . Frequent falls     Patient Active Problem List   Diagnosis Date Noted  . Elevated LFTs 08/16/2014  . Dementia 08/15/2014  . Insomnia 08/15/2014  . Benign hypertensive renal disease 08/15/2014    Past Surgical History  Procedure Laterality Date  . Tubal ligation      Current Outpatient Rx  Name  Route  Sig  Dispense  Refill  . acetaminophen (TYLENOL) 325 MG tablet   Oral   Take 650 mg by mouth every 6 (six) hours as needed for mild pain or headache.          Marland Kitchen alum & mag hydroxide-simeth (MAALOX/MYLANTA) 200-200-20 MG/5ML suspension   Oral  Take 30 mLs by mouth as needed for indigestion or heartburn.          Marland Kitchen aspirin EC 81 MG tablet   Oral   Take 81 mg by mouth daily.         Marland Kitchen bismuth subsalicylate (PEPTO BISMOL) 262 MG/15ML suspension   Oral   Take 30 mLs by mouth every 6 (six) hours as needed (for GI upset).         . Cholecalciferol (VITAMIN D3) 5000 UNITS TABS   Oral   Take 5,000 Units by mouth daily.         . Cranberry 405 MG CAPS   Oral   Take 1 capsule by mouth 2 (two) times daily.         Marland Kitchen donepezil (ARICEPT) 10 MG tablet   Oral   Take 10 mg by mouth at bedtime.         Marland Kitchen loperamide (IMODIUM A-D) 2 MG tablet   Oral   Take 2 mg by mouth 4 (four) times daily as needed for diarrhea or loose stools.         Marland Kitchen losartan (COZAAR) 50 MG tablet   Oral   Take 1 tablet (50 mg total) by mouth daily.   90 tablet   1   . magnesium hydroxide (MILK OF MAGNESIA) 400 MG/5ML suspension   Oral   Take 30 mLs by mouth daily as needed for mild  constipation.          . mirtazapine (REMERON) 15 MG tablet   Oral   Take 15 mg by mouth at bedtime.         . Multiple Vitamin (MULTIVITAMIN WITH MINERALS) TABS tablet   Oral   Take 1 tablet by mouth daily.         . traZODone (DESYREL) 50 MG tablet   Oral   Take 50 mg by mouth at bedtime.           Allergies Review of patient's allergies indicates no known allergies.  Family History  Problem Relation Age of Onset  . Hypertension Mother   . Hypertension Father   . Stroke Mother   . Stroke Sister   . Heart attack Father   . Heart disease Mother   . Diabetes Sister     Social History Social History  Substance Use Topics  . Smoking status: Never Smoker   . Smokeless tobacco: Never Used  . Alcohol Use: No    Review of Systems Constitutional: No fever ENT: No URI Gastrointestinal: no vomiting.  No diarrhea.   10-point ROS otherwise negative.  ____________________________________________   PHYSICAL EXAM:  VITAL SIGNS: BP  142/68 mmHg  Pulse 55  Temp(Src) 98.4 F (36.9 C) (Oral)  Resp 24  SpO2 100%   Constitutional: Alert and appropriate. Well appearing and in no acute distress. Eyes: Conjunctivae are normal. PERRL. EOMI. Head: Atraumatic. Nose: No congestion/rhinnorhea. Mouth/Throat: Mucous membranes are moist.  Oropharynx non-erythematous. Neck: No stridor.   Lymphatic: No cervical lymphadenopathy. Cardiovascular: Normal rate, regular rhythm. Grossly normal heart sounds.  Peripheral pulses 2+ B Respiratory: Normal respiratory effort.  No retractions. Lungs CTAB. Gastrointestinal: Soft and nontender. No distention. Normal bowel sounds.  Musculoskeletal: No lower extremity tenderness nor edema.  No calf TTP. Full normal range of motion Neurologic:  Normal speech and language. No gross focal neurologic deficits are appreciated. Speech is normal.  Skin:  Skin is warm, dry and intact. No rash noted. Psychiatric: Mood and affect are normal. Speech and behavior are normal.  ____________________________________________   LABS (all labs ordered are listed, but only abnormal results are displayed)  Labs Reviewed  BASIC METABOLIC PANEL - Abnormal; Notable for the following:    Chloride 100 (*)    Glucose, Bld 121 (*)    GFR calc non Af Amer 57 (*)    All other components within normal limits  CBC WITH DIFFERENTIAL/PLATELET - Abnormal; Notable for the following:    Hemoglobin 11.3 (*)    MCHC 31.9 (*)    All other components within normal limits  URINALYSIS COMPLETEWITH MICROSCOPIC ([facility] ONLY) - Abnormal; Notable for the following:    Color, Urine YELLOW (*)    APPearance CLEAR (*)    Leukocytes, UA TRACE (*)    Squamous Epithelial / LPF 0-5 (*)    All other components within normal limits  TROPONIN I   ____________________________________________  EKG   Date: 10/03/2014  Rate: 80  Rhythm: normal sinus rhythm  QRS Axis: normal  Intervals: normal  ST/T Wave abnormalities: T flat 1, aVL, V2,  V6  Conduction Disutrbances: none  Narrative Interpretation: unremarkable  ____________________________________________   INITIAL IMPRESSION / ASSESSMENT AND PLAN / ED COURSE  Pertinent labs & imaging results that were available during my care of the patient were reviewed by me and considered in my medical decision making (see chart for details).  From history it is not clear to me that patient actually  had a syncopal episode. It sounds that it is more consistent with patient falling asleep as she was completely normal on EMS arrival. Patient with normal exam and is acting at baseline in the ER. Family is comfortable taking her home with primary care follow-up. ____________________________________________   FINAL CLINICAL IMPRESSION(S) / ED DIAGNOSES  Transient difficulty arousing    Maurilio Lovely, MD 10/03/14 [id number]

## 2014-10-02 NOTE — ED Notes (Signed)
Family at bedside. 

## 2014-10-02 NOTE — ED Notes (Signed)
Pt in from group home with near syncope. Pt currently denies any needs and VSS

## 2014-10-03 NOTE — Discharge Instructions (Signed)
It is not clear what caused today's episode. Mary [name] had normal laboratory data and normal vital signs. Please return to the ER for new or worsening symptoms, passing out, fever, vomiting, or for any other concerns.   Near-Syncope Near-syncope (commonly known as near fainting) is sudden weakness, dizziness, or feeling like you might pass out. During an episode of near-syncope, you may also develop pale skin, have tunnel vision, or feel sick to your stomach (nauseous). Near-syncope may occur when getting up after sitting or while standing for a long time. It is caused by a sudden decrease in blood flow to the brain. This decrease can result from various causes or triggers, most of which are not serious. However, because near-syncope can sometimes be a sign of something serious, a medical evaluation is required. The specific cause is often not determined. HOME CARE INSTRUCTIONS  Monitor your condition for any changes. The following actions may help to alleviate any discomfort you are experiencing:  Have someone stay with you until you feel stable.  Lie down right away and prop your feet up if you start feeling like you might faint. Breathe deeply and steadily. Wait until all the symptoms have passed. Most of these episodes last only a few minutes. You may feel tired for several hours.   Drink enough fluids to keep your urine clear or pale yellow.   If you are taking blood pressure or heart medicine, get up slowly when seated or lying down. Take several minutes to sit and then stand. This can reduce dizziness.  Follow up with your health care provider as directed. SEEK IMMEDIATE MEDICAL CARE IF:   You have a severe headache.   You have unusual pain in the chest, abdomen, or back.   You are bleeding from the mouth or rectum, or you have black or tarry stool.   You have an irregular or very fast heartbeat.   You have repeated fainting or have seizure-like jerking during an episode.    You faint when sitting or lying down.   You have confusion.   You have difficulty walking.   You have severe weakness.   You have vision problems.  MAKE SURE YOU:   Understand these instructions.  Will watch your condition.  Will get help right away if you are not doing well or get worse. Document Released: 02/03/2005 Document Revised: 02/08/2013 Document Reviewed: 07/09/2012 [facility] Patient Information 2015 [place], [place]. This information is not intended to replace advice given to you by your health care provider. Make sure you discuss any questions you have with your health care provider.

## 2014-10-15 ENCOUNTER — Observation Stay
Admission: EM | Admit: 2014-10-15 | Discharge: 2014-10-17 | Disposition: A | Discharge status: Home / Self Care | Payer: Medicare Other | Attending: Internal Medicine | Admitting: Internal Medicine

## 2014-10-15 ENCOUNTER — Encounter: Payer: Self-pay | Admitting: Emergency Medicine

## 2014-10-15 DIAGNOSIS — Z9842 Cataract extraction status, left eye: Secondary | ICD-10-CM | POA: Diagnosis not present

## 2014-10-15 DIAGNOSIS — M1990 Unspecified osteoarthritis, unspecified site: Secondary | ICD-10-CM | POA: Insufficient documentation

## 2014-10-15 DIAGNOSIS — Z833 Family history of diabetes mellitus: Secondary | ICD-10-CM | POA: Insufficient documentation

## 2014-10-15 DIAGNOSIS — F0280 Dementia in other diseases classified elsewhere without behavioral disturbance: Secondary | ICD-10-CM | POA: Diagnosis not present

## 2014-10-15 DIAGNOSIS — R55 Syncope and collapse: Secondary | ICD-10-CM | POA: Insufficient documentation

## 2014-10-15 DIAGNOSIS — R569 Unspecified convulsions: Secondary | ICD-10-CM | POA: Diagnosis not present

## 2014-10-15 DIAGNOSIS — Z7982 Long term (current) use of aspirin: Secondary | ICD-10-CM | POA: Diagnosis not present

## 2014-10-15 DIAGNOSIS — I10 Essential (primary) hypertension: Secondary | ICD-10-CM | POA: Diagnosis not present

## 2014-10-15 DIAGNOSIS — Z87440 Personal history of urinary (tract) infections: Secondary | ICD-10-CM | POA: Insufficient documentation

## 2014-10-15 DIAGNOSIS — Z79899 Other long term (current) drug therapy: Secondary | ICD-10-CM | POA: Insufficient documentation

## 2014-10-15 DIAGNOSIS — Z8249 Family history of ischemic heart disease and other diseases of the circulatory system: Secondary | ICD-10-CM | POA: Insufficient documentation

## 2014-10-15 DIAGNOSIS — G319 Degenerative disease of nervous system, unspecified: Secondary | ICD-10-CM | POA: Insufficient documentation

## 2014-10-15 DIAGNOSIS — I739 Peripheral vascular disease, unspecified: Secondary | ICD-10-CM | POA: Insufficient documentation

## 2014-10-15 DIAGNOSIS — R296 Repeated falls: Secondary | ICD-10-CM | POA: Diagnosis not present

## 2014-10-15 DIAGNOSIS — Z823 Family history of stroke: Secondary | ICD-10-CM | POA: Diagnosis not present

## 2014-10-15 DIAGNOSIS — Z9841 Cataract extraction status, right eye: Secondary | ICD-10-CM | POA: Diagnosis not present

## 2014-10-15 DIAGNOSIS — N289 Disorder of kidney and ureter, unspecified: Secondary | ICD-10-CM | POA: Diagnosis not present

## 2014-10-15 DIAGNOSIS — G309 Alzheimer's disease, unspecified: Secondary | ICD-10-CM | POA: Insufficient documentation

## 2014-10-15 DIAGNOSIS — G4700 Insomnia, unspecified: Secondary | ICD-10-CM | POA: Insufficient documentation

## 2014-10-15 HISTORY — DX: Dementia in other diseases classified elsewhere, unspecified severity, without behavioral disturbance, psychotic disturbance, mood disturbance, and anxiety: F02.80

## 2014-10-15 HISTORY — DX: Alzheimer's disease, unspecified: G30.9

## 2014-10-15 LAB — URINALYSIS COMPLETE WITH MICROSCOPIC (ARMC ONLY)
BILIRUBIN URINE: NEGATIVE
Glucose, UA: NEGATIVE mg/dL
HGB URINE DIPSTICK: NEGATIVE
KETONES UR: NEGATIVE mg/dL
NITRITE: NEGATIVE
PH: 6 (ref 5.0–8.0)
Protein, ur: 30 mg/dL — AB
Specific Gravity, Urine: 1.012 (ref 1.005–1.030)
Squamous Epithelial / LPF: NONE SEEN
WBC UA: NONE SEEN WBC/hpf (ref 0–5)

## 2014-10-15 LAB — CBC
HCT: 35 % (ref 35.0–47.0)
HEMOGLOBIN: 11.4 g/dL — AB (ref 12.0–16.0)
MCH: 27.8 pg (ref 26.0–34.0)
MCHC: 32.6 g/dL (ref 32.0–36.0)
MCV: 85.3 fL (ref 80.0–100.0)
Platelets: 254 10*3/uL (ref 150–440)
RBC: 4.1 MIL/uL (ref 3.80–5.20)
RDW: 14.6 % — ABNORMAL HIGH (ref 11.5–14.5)
WBC: 7 10*3/uL (ref 3.6–11.0)

## 2014-10-15 LAB — COMPREHENSIVE METABOLIC PANEL
ALBUMIN: 2.8 g/dL — AB (ref 3.5–5.0)
ALK PHOS: 64 U/L (ref 38–126)
ALT: 35 U/L (ref 14–54)
ANION GAP: 6 (ref 5–15)
AST: 41 U/L (ref 15–41)
BILIRUBIN TOTAL: 0.6 mg/dL (ref 0.3–1.2)
BUN: 16 mg/dL (ref 6–20)
CALCIUM: 9.1 mg/dL (ref 8.9–10.3)
CO2: 30 mmol/L (ref 22–32)
CREATININE: 0.76 mg/dL (ref 0.44–1.00)
Chloride: 101 mmol/L (ref 101–111)
GFR calc Af Amer: >60 mL/min (ref >60)
GFR calc non Af Amer: >60 mL/min (ref >60)
GLUCOSE: 83 mg/dL (ref 65–99)
Potassium: 4.1 mmol/L (ref 3.5–5.1)
Sodium: 137 mmol/L (ref 135–145)
TOTAL PROTEIN: 5.8 g/dL — AB (ref 6.5–8.1)

## 2014-10-15 LAB — TROPONIN I: Troponin I: <0.03 ng/mL (ref <0.031)

## 2014-10-15 MED ORDER — ACETAMINOPHEN 325 MG PO TABS: 650.0000 mg | ORAL_TABLET | Freq: Four times a day (QID) | ORAL | Status: DC | PRN

## 2014-10-15 MED ORDER — MORPHINE SULFATE (PF) 2 MG/ML IV SOLN: 2.0000 mg | INTRAVENOUS | Status: DC | PRN

## 2014-10-15 MED ORDER — SODIUM CHLORIDE 0.9 % IJ SOLN
3.0000 mL | Freq: Two times a day (BID) | INTRAMUSCULAR | Status: DC
  Administered 2014-10-16 (×2): 3 mL via INTRAVENOUS

## 2014-10-15 MED ORDER — SODIUM CHLORIDE 0.9 % IV SOLN
INTRAVENOUS | Status: DC
  Administered 2014-10-16: 01:00:00 via INTRAVENOUS

## 2014-10-15 MED ORDER — ONDANSETRON HCL 4 MG/2ML IJ SOLN: 4.0000 mg | Freq: Four times a day (QID) | INTRAMUSCULAR | Status: DC | PRN

## 2014-10-15 MED ORDER — HYDRALAZINE HCL 20 MG/ML IJ SOLN
10.0000 mg | INTRAMUSCULAR | Status: DC | PRN
  Administered 2014-10-15: 10 mg via INTRAVENOUS
  Filled 2014-10-15: qty 1

## 2014-10-15 MED ORDER — HEPARIN SODIUM (PORCINE) 5000 UNIT/ML IJ SOLN
5000.0000 [IU] | Freq: Three times a day (TID) | INTRAMUSCULAR | Status: DC
  Administered 2014-10-16 (×2): 5000 [IU] via SUBCUTANEOUS
  Filled 2014-10-15 (×2): qty 1

## 2014-10-15 MED ORDER — ACETAMINOPHEN 650 MG RE SUPP: 650.0000 mg | Freq: Four times a day (QID) | RECTAL | Status: DC | PRN

## 2014-10-15 MED ORDER — ONDANSETRON HCL 4 MG PO TABS: 4.0000 mg | ORAL_TABLET | Freq: Four times a day (QID) | ORAL | Status: DC | PRN

## 2014-10-15 MED ORDER — OXYCODONE HCL 5 MG PO TABS: 5.0000 mg | ORAL_TABLET | ORAL | Status: DC | PRN

## 2014-10-15 NOTE — ED Notes (Signed)
Patient from [facility] care home. After dinner patient had an acute syncopal episode while sitting at table. Patient is lethargic but A+Ox4

## 2014-10-15 NOTE — H&P (Signed)
[facility] - [place] at [facility]   PATIENT NAME: Mary [name]    MR#:  [id number]  DATE OF BIRTH:  1921-05-08   DATE OF ADMISSION:  10/15/2014  PRIMARY CARE PHYSICIAN: Olevia Perches, DO   REQUESTING/REFERRING PHYSICIAN: Derrill Kay  CHIEF COMPLAINT:   Chief Complaint  Patient presents with  . Loss of Consciousness    HISTORY OF PRESENT ILLNESS:  Mary [name]  is a 81 y.o. female with a known history of essential hypertension, Alzheimer's dementia resenting after syncopal episode. Patient unable to provide meaningful information given mental status/medical condition. History obtained from patient's daughter present at bedside. She was in her usual state of health at her nursing facility and they were eating dinner when she passed out. No loss of bowel or bladder function no witnessed seizure activity now back to baseline. Had a syncopal episode approximately 2 weeks ago as well  PAST MEDICAL HISTORY:   Past Medical History  Diagnosis Date  . Arthritis   . Hypertension   . Kidney disease   . Frequent falls   . Alzheimer disease     PAST SURGICAL HISTORY:   Past Surgical History  Procedure Laterality Date  . Tubal ligation      SOCIAL HISTORY:   Social History  Substance Use Topics  . Smoking status: Never Smoker   . Smokeless tobacco: Never Used  . Alcohol Use: No    FAMILY HISTORY:   Family History  Problem Relation Age of Onset  . Hypertension Mother   . Hypertension Father   . Stroke Mother   . Stroke Sister   . Heart attack Father   . Heart disease Mother   . Diabetes Sister     DRUG ALLERGIES:  No Known Allergies  REVIEW OF SYSTEMS:  Unable to obtain given patient's mental status/medical condition   MEDICATIONS AT HOME:   Prior to Admission medications   Medication Sig Start Date End Date Taking? Authorizing Provider  acetaminophen (TYLENOL) 325 MG tablet Take 650 mg by mouth every 6 (six) hours as needed for  mild pain or headache.    Yes Historical Provider, MD  alum & mag hydroxide-simeth (MAALOX/MYLANTA) 200-200-20 MG/5ML suspension Take 30 mLs by mouth as needed for indigestion or heartburn.    Yes Historical Provider, MD  aspirin EC 81 MG tablet Take 81 mg by mouth daily.   Yes Historical Provider, MD  bismuth subsalicylate (PEPTO BISMOL) 262 MG/15ML suspension Take 30 mLs by mouth every 6 (six) hours as needed (for GI upset).   Yes Historical Provider, MD  Cholecalciferol (VITAMIN D3) 5000 UNITS TABS Take 5,000 Units by mouth daily.   Yes Historical Provider, MD  Cranberry 405 MG CAPS Take 1 capsule by mouth 2 (two) times daily.   Yes Historical Provider, MD  donepezil (ARICEPT) 10 MG tablet Take 10 mg by mouth at bedtime.   Yes Historical Provider, MD  loperamide (IMODIUM A-D) 2 MG tablet Take 2 mg by mouth 4 (four) times daily as needed for diarrhea or loose stools.   Yes Historical Provider, MD  losartan (COZAAR) 50 MG tablet Take 1 tablet (50 mg total) by mouth daily. 07/31/14  Yes Megan P Johnson, DO  magnesium hydroxide (MILK OF MAGNESIA) 400 MG/5ML suspension Take 30 mLs by mouth daily as needed for mild constipation.    Yes Historical Provider, MD  mirtazapine (REMERON) 15 MG tablet Take 15 mg by mouth at bedtime.   Yes Historical Provider, MD  Multiple Vitamin (  MULTIVITAMIN WITH MINERALS) TABS tablet Take 1 tablet by mouth daily.   Yes Historical Provider, MD  traZODone (DESYREL) 50 MG tablet Take 50 mg by mouth at bedtime.   Yes Historical Provider, MD      VITAL SIGNS:  Blood pressure 184/78, pulse 85, temperature 98.1 F (36.7 C), temperature source Oral, resp. rate 16, height 5\' 5"  (1.651 m), SpO2 98 %.  PHYSICAL EXAMINATION:  VITAL SIGNS: Filed Vitals:   10/15/14 2335  BP: 184/78  Pulse: 85  Temp: 98.1 F (36.7 C)  Resp: 16   GENERAL:81 y.o.female currently in no acute distress.  HEAD: Normocephalic, atraumatic.  EYES: Pupils equal, round, reactive to light. Extraocular  muscles intact. No scleral icterus.  MOUTH: Dry mucosal membrane. Dentition poor. No abscess noted.  EAR, NOSE, THROAT: Clear without exudates. No external lesions.  NECK: Supple. No thyromegaly. No nodules. No JVD.  PULMONARY: Clear to ascultation, without wheeze rails or rhonci. No use of accessory muscles, Good respiratory effort. good air entry bilaterally CHEST: Nontender to palpation.  CARDIOVASCULAR: S1 and S2. Irregular rate and irregular rhythm. No murmurs, rubs, or gallops. No edema. Pedal pulses 2+ bilaterally.  GASTROINTESTINAL: Soft, nontender, nondistended. No masses. Positive bowel sounds. No hepatosplenomegaly.  MUSCULOSKELETAL: No swelling, clubbing, or edema. Range of motion full in all extremities.  NEUROLOGIC: Cranial nerves II through XII are intact. No gross focal neurological deficits. Sensation intact. Reflexes intact.  SKIN: No ulceration, lesions, rashes, or cyanosis. Skin warm and dry. Turgor intact.  PSYCHIATRIC: Mood, affect flattened The patient is sleeping but arousable, oriented to self, insight and judgment poor LABORATORY PANEL:   CBC  Recent Labs Lab 10/15/14 2006  WBC 7.0  HGB 11.4*  HCT 35.0  PLT 254   ------------------------------------------------------------------------------------------------------------------  Chemistries   Recent Labs Lab 10/15/14 2045  NA 137  K 4.1  CL 101  CO2 30  GLUCOSE 83  BUN 16  CREATININE 0.76  CALCIUM 9.1  AST 41  ALT 35  ALKPHOS 64  BILITOT 0.6   ------------------------------------------------------------------------------------------------------------------  Cardiac Enzymes  Recent Labs Lab 10/15/14 2055  TROPONINI <0.03   ------------------------------------------------------------------------------------------------------------------  RADIOLOGY:  No results found.  EKG:   Orders placed or performed during the hospital encounter of 10/15/14  . ED EKG  . ED EKG    IMPRESSION AND  PLAN:   81 year old African-American female history of essential hypertension as well as dementia presenting after syncopal episode.  1. Syncope, unspecified: Place on telemetry, check orthostatic vital signs, gentle IV fluid hydration 2. Essential hypertension: Continue home medications, add when necessary hydralazine 3. Dementia: Aricept 4. Venous thromboembolism prophylactic heparin subcutaneous    All the records are reviewed and case discussed with ED provider. Management plans discussed with the patient, family and they are in agreement.  CODE STATUS: Full  TOTAL TIME TAKING CARE OF THIS PATIENT: 35 minutes.    Hower,  Mardi Mainland.D on 10/15/2014 at 11:55 PM  Between 7am to 6pm - Pager - [phone]  After 6pm: House Pager: - [phone]  [facility]  Office  [phone]  CC: Primary care physician; Olevia Perches, DO

## 2014-10-15 NOTE — ED Notes (Signed)
Dr. Hower at bedside.  

## 2014-10-15 NOTE — ED Provider Notes (Addendum)
[facility] Emergency Department Provider Note     Time seen: ----------------------------------------- 6:51 PM on 10/15/2014 -----------------------------------------    I have reviewed the triage vital signs and the nursing notes.   HISTORY  Chief Complaint No chief complaint on file.    HPI Mary [name] is a 81 y.o. female who presents ER being brought from [facility]. Patient was eating dinner and had a near syncopal event while sitting at the table. She was recently seen 2 weeks, ER for same. Here she is able to tell us what happened, she denies complaints. Report was that she just slumped over after the meal.   Past Medical History  Diagnosis Date  . Arthritis   . Hypertension   . Kidney disease   . Frequent falls     Patient Active Problem List   Diagnosis Date Noted  . Elevated LFTs 08/16/2014  . Dementia 08/15/2014  . Insomnia 08/15/2014  . Benign hypertensive renal disease 08/15/2014    Past Surgical History  Procedure Laterality Date  . Tubal ligation      Allergies Review of patient's allergies indicates no known allergies.  Social History Social History  Substance Use Topics  . Smoking status: Never Smoker   . Smokeless tobacco: Never Used  . Alcohol Use: No    Review of Systems Constitutional: Negative for fever. Eyes: Negative for visual changes. ENT: Negative for sore throat. Cardiovascular: Negative for chest pain. Respiratory: Negative for shortness of breath. Gastrointestinal: Negative for abdominal pain, vomiting and diarrhea. Genitourinary: Negative for dysuria. Musculoskeletal: Negative for back pain. Skin: Negative for rash. Neurological: Negative for headaches, focal weakness or numbness.  10-point ROS otherwise negative.  ____________________________________________   PHYSICAL EXAM:  VITAL SIGNS: ED Triage Vitals  Enc Vitals Group     BP --      Pulse --      Resp --       Temp --      Temp src --      SpO2 --      Weight --      Height --      Head Cir --      Peak Flow --      Pain Score --      Pain Loc --      Pain Edu? --      Excl. in GC? --     Constitutional: Alert and oriented. Somewhat lethargic, but well appearing, no acute distress. Eyes: Conjunctivae are normal. PERRL. Normal extraocular movements. ENT   Head: Normocephalic and atraumatic.   Nose: No congestion/rhinnorhea.   Mouth/Throat: Mucous membranes are moist.   Neck: No stridor. Cardiovascular: Normal rate, regular rhythm. Normal and symmetric distal pulses are present in all extremities. No murmurs, rubs, or gallops. Respiratory: Normal respiratory effort without tachypnea nor retractions. Breath sounds are clear and equal bilaterally. No wheezes/rales/rhonchi. Gastrointestinal: Soft and nontender. No distention. No abdominal bruits.  Musculoskeletal: Nontender with normal range of motion in all extremities. No joint effusions.  No lower extremity tenderness nor edema. Neurologic:  Normal speech and language. No gross focal neurologic deficits are appreciated. Speech is normal. No gait instability. Skin:  Skin is warm, dry and intact. No rash noted. Psychiatric: Mood and affect are normal. Speech and behavior are normal. Patient exhibits appropriate insight and judgment. ____________________________________________  EKG: Interpreted by me. Atrial fibrillation with a rate of 70 bpm, wide QRS, normal QT interval. Likely septal infarct. After axis deviation  ____________________________________________  ED COURSE:  Pertinent labs & imaging results that were available during my care of the patient were reviewed by me and considered in my medical decision making (see chart for details). Unclear etiology. Recently seen for same, will need a recheck of labs and EKG ____________________________________________    LABS (pertinent positives/negatives)  Labs Reviewed   CBC - Abnormal; Notable for the following:    Hemoglobin 11.4 (*)    RDW 14.6 (*)    All other components within normal limits  URINE CULTURE  URINALYSIS COMPLETEWITH MICROSCOPIC ([facility] ONLY)  TROPONIN I  COMPREHENSIVE METABOLIC PANEL    ____________________________________________  FINAL ASSESSMENT AND PLAN  Syncope, atrial fibrillation  Plan: Patient with labs as dictated above. Final disposition will be checked out to Dr. Derrill Kay. Patient likely will need admission and observation for recurrent bouts of syncope. Etiology is unclear at this time.   Emily Filbert, MD   Emily Filbert, MD 10/15/14 1610  Emily Filbert, MD 10/15/14 2133

## 2014-10-15 NOTE — ED Notes (Signed)
Assisted patient up to bathroom and back into bed. Waiting for hospitalist to be admitted,

## 2014-10-16 ENCOUNTER — Observation Stay: Payer: Medicare Other

## 2014-10-16 ENCOUNTER — Encounter: Payer: Self-pay | Admitting: Radiology

## 2014-10-16 ENCOUNTER — Observation Stay (HOSPITAL_BASED_OUTPATIENT_CLINIC_OR_DEPARTMENT_OTHER)
Admit: 2014-10-16 | Discharge: 2014-10-16 | Disposition: A | Discharge status: Home / Self Care | Payer: Medicare Other | Attending: Internal Medicine | Admitting: Internal Medicine

## 2014-10-16 DIAGNOSIS — I340 Nonrheumatic mitral (valve) insufficiency: Secondary | ICD-10-CM

## 2014-10-16 LAB — TROPONIN I: TROPONIN I: 0.03 ng/mL (ref <0.031)

## 2014-10-16 LAB — BASIC METABOLIC PANEL
ANION GAP: 6 (ref 5–15)
BUN: 15 mg/dL (ref 6–20)
CHLORIDE: 104 mmol/L (ref 101–111)
CO2: 28 mmol/L (ref 22–32)
Calcium: 9.3 mg/dL (ref 8.9–10.3)
Creatinine, Ser: 0.72 mg/dL (ref 0.44–1.00)
GFR calc non Af Amer: >60 mL/min (ref >60)
Glucose, Bld: 83 mg/dL (ref 65–99)
POTASSIUM: 4.2 mmol/L (ref 3.5–5.1)
SODIUM: 138 mmol/L (ref 135–145)

## 2014-10-16 MED ORDER — MIRTAZAPINE 15 MG PO TABS
15.0000 mg | ORAL_TABLET | Freq: Every day | ORAL | Status: DC
  Administered 2014-10-16: 15 mg via ORAL
  Filled 2014-10-16: qty 1

## 2014-10-16 MED ORDER — POLYMYXIN B-TRIMETHOPRIM 10000-0.1 UNIT/ML-% OP SOLN
1.0000 [drp] | OPHTHALMIC | Status: DC
  Administered 2014-10-16 – 2014-10-17 (×6): 1 [drp] via OPHTHALMIC
  Filled 2014-10-16: qty 10

## 2014-10-16 MED ORDER — LOSARTAN POTASSIUM 50 MG PO TABS
50.0000 mg | ORAL_TABLET | Freq: Every day | ORAL | Status: DC
  Administered 2014-10-16 – 2014-10-17 (×2): 50 mg via ORAL
  Filled 2014-10-16 (×2): qty 1

## 2014-10-16 MED ORDER — LACOSAMIDE 50 MG PO TABS
50.0000 mg | ORAL_TABLET | Freq: Two times a day (BID) | ORAL | Status: DC
  Administered 2014-10-16 – 2014-10-17 (×3): 50 mg via ORAL
  Filled 2014-10-16 (×3): qty 1

## 2014-10-16 MED ORDER — VITAMIN D 1000 UNITS PO TABS
5000.0000 [IU] | ORAL_TABLET | Freq: Every day | ORAL | Status: DC
  Administered 2014-10-16: 5000 [IU] via ORAL
  Filled 2014-10-16 (×2): qty 5

## 2014-10-16 MED ORDER — ENOXAPARIN SODIUM 30 MG/0.3ML ~~LOC~~ SOLN
30.0000 mg | SUBCUTANEOUS | Status: DC
  Administered 2014-10-16: 30 mg via SUBCUTANEOUS
  Filled 2014-10-16: qty 0.3

## 2014-10-16 MED ORDER — BISMUTH SUBSALICYLATE 262 MG/15ML PO SUSP: 30.0000 mL | Freq: Four times a day (QID) | ORAL | Status: DC | PRN

## 2014-10-16 MED ORDER — QUETIAPINE FUMARATE 25 MG PO TABS
25.0000 mg | ORAL_TABLET | Freq: Every day | ORAL | Status: DC
  Administered 2014-10-16: 25 mg via ORAL
  Filled 2014-10-16: qty 1

## 2014-10-16 MED ORDER — ADULT MULTIVITAMIN W/MINERALS CH
1.0000 | ORAL_TABLET | Freq: Every day | ORAL | Status: DC
  Administered 2014-10-16 – 2014-10-17 (×2): 1 via ORAL
  Filled 2014-10-16 (×2): qty 1

## 2014-10-16 MED ORDER — CRANBERRY 405 MG PO CAPS: 1.0000 | ORAL_CAPSULE | Freq: Two times a day (BID) | ORAL | Status: DC

## 2014-10-16 MED ORDER — POLYMYXIN B-TRIMETHOPRIM 10000-0.1 UNIT/ML-% OP SOLN: 2.0000 [drp] | OPHTHALMIC | Status: DC

## 2014-10-16 MED ORDER — DONEPEZIL HCL 5 MG PO TABS
10.0000 mg | ORAL_TABLET | Freq: Every day | ORAL | Status: DC
  Administered 2014-10-16 (×2): 10 mg via ORAL
  Filled 2014-10-16 (×2): qty 2

## 2014-10-16 MED ORDER — ASPIRIN EC 81 MG PO TBEC
81.0000 mg | DELAYED_RELEASE_TABLET | Freq: Every day | ORAL | Status: DC
  Administered 2014-10-16 – 2014-10-17 (×2): 81 mg via ORAL
  Filled 2014-10-16 (×2): qty 1

## 2014-10-16 MED ORDER — TRAZODONE HCL 50 MG PO TABS
50.0000 mg | ORAL_TABLET | Freq: Every day | ORAL | Status: DC
  Administered 2014-10-16 (×2): 50 mg via ORAL
  Filled 2014-10-16 (×2): qty 1

## 2014-10-16 NOTE — Care Management Note (Signed)
Case Management Note  Patient Details  Name: Mary [name] MRN: [id number] Date of Birth: 1921/07/27  Subjective/Objective:  81yo Mary Mary [name] was admitted to an Observation bed on 10/15/14 after a syncopal episode at [facility]. On 10/02/14 Mary Mary [name] was also brought to the ED with a syncopal episode but was not admitted. In the ED on 10/15/14 Mary [name] EKG showed atrial -Fib. Mary [name] is currently in a telemetry bed. Hx of dementia and frequent falls. Daughter was at bedside and provided most of the following information: PCP= Dr Olevia Perches at [facility] in [place]. Pharmacy=[facility] in [place]. Mary [name] has a front wheeled rolling walker. No home oxygen. No current home health services at [facility]. Manager at [facility] reported during a phone conversation that if home health was needed after hospital discharge they prefer [facility]. Case management will follow for discharge planning.                  Action/Plan:   Expected Discharge Date:                  Expected Discharge Plan:     In-House Referral:     Discharge planning Services     Post Acute Care Choice:    Choice offered to:     DME Arranged:    DME Agency:     HH Arranged:    HH Agency:     Status of Service:     Medicare Important Message Given:    Date Medicare IM Given:    Medicare IM give by:    Date Additional Medicare IM Given:    Additional Medicare Important Message give by:     If discussed at Long Length of Stay Meetings, dates discussed:    Additional Comments:  Mary [name] A, RN 10/16/2014, 5:07 PM

## 2014-10-16 NOTE — Progress Notes (Signed)
[facility] Physicians - [place] [facility]   PATIENT NAME: Mary [name]    MR#:  [id number]  DATE OF BIRTH:  [birth year]  SUBJECTIVE:  CHIEF COMPLAINT:   Chief Complaint  Patient presents with  . Loss of Consciousness   Admitted overnight after episode of non-responsiveness at her living facility. Her daughter is present and describes that the patient was sitting at meal time and became nonresponsive. She did not have any jerking motion, did not fall or seem to lose consciousness. She simply stopped responding to external stimuli. They state that she has had a similar episode about 2 weeks ago and was planning to see a neurologist for further evaluation. It is difficult to tell if the patient is confused after these episodes as she has underlying dementia. No focal defect. At this time she seems to be back to her baseline. Of note her daughter's report a significant weight loss over the past 1-2 years.  REVIEW OF SYSTEMS:   Review of Systems  Constitutional: Negative for fever.  Respiratory: Negative for shortness of breath.   Cardiovascular: Negative for chest pain and palpitations.  Gastrointestinal: Negative for nausea, vomiting and abdominal pain.  Genitourinary: Negative for dysuria.    DRUG ALLERGIES:  No Known Allergies  VITALS:  Blood pressure 164/73, pulse 62, temperature 98.6 F (37 C), temperature source Oral, resp. rate 16, height  (1.651 m), weight 46.267 kg (102 lb), SpO2 100 %.  PHYSICAL EXAMINATION:  GENERAL:  81 y.o.-year-old patient lying in the bed with no acute distress. Thin  EYES: Pupils equal, round, reactive to light and accommodation. No scleral icterus. Extraocular muscles intact.  HEENT: Head atraumatic, normocephalic. Oropharynx and nasopharynx clear. Mucous membranes are dry NECK:  Supple, no jugular venous distention. No thyroid enlargement, no tenderness.  LUNGS: Normal breath sounds bilaterally, no wheezing, rales,rhonchi or  crepitation. No use of accessory muscles of respiration.  CARDIOVASCULAR: S1, S2 normal. No murmurs, rubs, or gallops.  ABDOMEN: Soft, nontender, nondistended. Bowel sounds present. No organomegaly or mass.  EXTREMITIES: No pedal edema, cyanosis, or clubbing.  NEUROLOGIC: Cranial nerves II through XII are intact. Muscle strength 5/5 in all extremities. Sensation intact. Gait not checked.  PSYCHIATRIC: The patient is alert and oriented to person and place SKIN: No obvious rash, lesion, or ulcer.    LABORATORY PANEL:   CBC  Recent Labs Lab 10/15/14 2006  WBC 7.0  HGB 11.4*  HCT 35.0  PLT 254   ------------------------------------------------------------------------------------------------------------------  Chemistries   Recent Labs Lab 10/15/14 2045 10/16/14 0059  NA 137 138  K 4.1 4.2  CL 101 104  CO2 30 28  GLUCOSE 83 83  BUN 16 15  CREATININE 0.76 0.72  CALCIUM 9.1 9.3  AST 41  --   ALT 35  --   ALKPHOS 64  --   BILITOT 0.6  --    ------------------------------------------------------------------------------------------------------------------  Cardiac Enzymes  Recent Labs Lab 10/16/14 0653  TROPONINI <0.03   ------------------------------------------------------------------------------------------------------------------  RADIOLOGY:  No results found.  EKG:   Orders placed or performed during the hospital encounter of 10/15/14  . ED EKG  . ED EKG  . EKG 12-Lead  . EKG 12-Lead    ASSESSMENT AND PLAN:   #1 syncope versus seizure: No EKG changes, events in telemetry or elevated troponin.  No focal deficits. - check 2-D echocardiogram and noncontrast head CT - Obtain inpatient neurology consultation and EEG - Orthostatic vital signs  #2 hypertension - Blood pressure fairly well controlled,  continue home medications  #3 dementia - Seems to be at baseline for mental status, continue Aricept  All the records are reviewed and case discussed  with Care Management/Social Workerr. Management plans discussed with the patient, family and they are in agreement.  CODE STATUS: Full  TOTAL TIME TAKING CARE OF THIS PATIENT: 35 minutes.  Greater than 50% of time spent in care coordination and counseling. POSSIBLE D/C IN 1-2 DAYS, DEPENDING ON CLINICAL CONDITION.   Elby Showers M.D on 10/16/2014 at 1:20 PM  Between 7am to 6pm - Pager - [phone]  After 6pm go to [email or web address] - password [email or web address] [facility]  [facility]  Office  [phone]  CC: Primary care physician; Olevia Perches, DO

## 2014-10-16 NOTE — Clinical Social Work Note (Signed)
Clinical Social Work Assessment  Patient Details  Name: Mary [name] MRN: [id number] Date of Birth: [birth year]  Date of referral:  10/16/14               Reason for consult:  Facility Placement (pt is from [facility])                Permission sought to share information with:  Facility Medical sales representative, Family Supports Permission granted to share information::  Yes, Verbal Permission Granted  Name::     Daughter [phone]  Agency::  [facility]  Relationship::     Contact Information:     Housing/Transportation Living arrangements for the past 2 months:  Group Home ([facility]) Source of Information:  Patient, Facility, Adult Children Patient Interpreter Needed:  None Criminal Activity/Legal Involvement Pertinent to Current Situation/Hospitalization:  No - Comment as needed Significant Relationships:  Adult Children Lives with:  Facility Resident Do you feel safe going back to the place where you live?  Yes Need for family participation in patient care:  Yes (Comment)  Care giving concerns:  None expressed at this time   Office manager / plan:  CSW spoke to pt.  She gave CSW verbal permission to speak to pt's daughter and facility.  Per pt she was able to walk independently.  Per pt's daughter she does have a walker, but she refused to use it.  CSW also spoke to facility and they are in agreement with DC back to facility once pt is medically stable.    Employment status:  Disabled (Comment on whether or not currently receiving Disability), Retired Health and safety inspector:  Medicare, Medicaid In [place] PT Recommendations:    Information / Referral to community resources:     Patient/Family's Response to care:  Pt and pt's family were in agreement with DC back to [facility] once medically stable.  Patient/Family's Understanding of and Emotional Response to Diagnosis, Current Treatment, and Prognosis:  Pt's  daughter verbalized her understanding  And is in agreement with DC back to [facility]    Emotional Assessment Appearance:  Appears younger than stated age Attitude/Demeanor/Rapport:   (appropriate) Affect (typically observed):  Appropriate Orientation:  Oriented to Self, Oriented to Place, Fluctuating Orientation (Suspected and/or reported Sundowners) Alcohol / Substance use:  Never Used Psych involvement (Current and /or in the community):  No (Comment)  Discharge Needs  Concerns to be addressed:  Care Coordination Readmission within the last 30 days:  No Current discharge risk:  Physical Impairment Barriers to Discharge:  No Barriers Identified   Chauncy Passy, LCSW 10/16/2014, 4:18 PM

## 2014-10-16 NOTE — Progress Notes (Signed)
*  PRELIMINARY RESULTS* Echocardiogram 2D Echocardiogram has been performed.  Mary [name] 10/16/2014, 3:18 PM

## 2014-10-16 NOTE — Consult Note (Signed)
Reason for Consult: possible seizure Referring Physician: Dr. [name] is an 81 y.o. female.  HPI: seen at request of Dr. Volanda [name] for possible seizure;  81 yo RHD F presents to [facility] after passing out while at the table and becoming unresponsive.  This is the second time that it has happened.  Caregiver is at bedside and state that both episodes have occurred while sitting and that pt is confused even more for several hours.  There is no incontinence or tongue biting.  Pt has baseline dementia and is depended for all ADLs.  Pt has no recollection of events.  Past Medical History  Diagnosis Date  . Arthritis   . Hypertension   . Kidney disease   . Frequent falls   . Alzheimer disease     Past Surgical History  Procedure Laterality Date  . Tubal ligation      Family History  Problem Relation Age of Onset  . Hypertension Mother   . Hypertension Father   . Stroke Mother   . Stroke Sister   . Heart attack Father   . Heart disease Mother   . Diabetes Sister     Social History:  reports that she has never smoked. She has never used smokeless tobacco. She reports that she does not drink alcohol or use illicit drugs.  Allergies: No Known Allergies  Medications:  Personally reviewed by me  Results for orders placed or performed during the hospital encounter of 10/15/14 (from the past 48 hour(s))  CBC     Status: Abnormal   Collection Time: 10/15/14  8:06 PM  Result Value Ref Range   WBC 7.0 3.6 - 11.0 K/uL   RBC 4.10 3.80 - 5.20 MIL/uL   Hemoglobin 11.4 (L) 12.0 - 16.0 g/dL   HCT 35.0 35.0 - 47.0 %   MCV 85.3 80.0 - 100.0 fL   MCH 27.8 26.0 - 34.0 pg   MCHC 32.6 32.0 - 36.0 g/dL   RDW 14.6 (H) 11.5 - 14.5 %   Platelets 254 150 - 440 K/uL  Comprehensive metabolic panel     Status: Abnormal   Collection Time: 10/15/14  8:45 PM  Result Value Ref Range   Sodium 137 135 - 145 mmol/L   Potassium 4.1 3.5 - 5.1 mmol/L   Chloride 101 101 - 111 mmol/L   CO2 30 22 - 32  mmol/L   Glucose, Bld 83 65 - 99 mg/dL   BUN 16 6 - 20 mg/dL   Creatinine, Ser 0.76 0.44 - 1.00 mg/dL   Calcium 9.1 8.9 - 10.3 mg/dL   Total Protein 5.8 (L) 6.5 - 8.1 g/dL   Albumin 2.8 (L) 3.5 - 5.0 g/dL   AST 41 15 - 41 U/L   ALT 35 14 - 54 U/L   Alkaline Phosphatase 64 38 - 126 U/L   Total Bilirubin 0.6 0.3 - 1.2 mg/dL   GFR calc non Af Amer >60 >60 mL/min   GFR calc Af Amer >60 >60 mL/min    Comment: (NOTE) The eGFR has been calculated using the CKD EPI equation. This calculation has not been validated in all clinical situations. eGFR's persistently <60 mL/min signify possible Chronic Kidney Disease.    Anion gap 6 5 - 15  Troponin I     Status: None   Collection Time: 10/15/14  8:55 PM  Result Value Ref Range   Troponin I <0.03 <0.031 ng/mL    Comment:  NO INDICATION OF MYOCARDIAL INJURY.   Urinalysis complete, with microscopic ([facility] only)     Status: Abnormal   Collection Time: 10/15/14  9:03 PM  Result Value Ref Range   Color, Urine YELLOW (A) YELLOW   APPearance CLEAR (A) CLEAR   Glucose, UA NEGATIVE NEGATIVE mg/dL   Bilirubin Urine NEGATIVE NEGATIVE   Ketones, ur NEGATIVE NEGATIVE mg/dL   Specific Gravity, Urine 1.012 1.005 - 1.030   Hgb urine dipstick NEGATIVE NEGATIVE   pH 6.0 5.0 - 8.0   Protein, ur 30 (A) NEGATIVE mg/dL   Nitrite NEGATIVE NEGATIVE   Leukocytes, UA TRACE (A) NEGATIVE   RBC / HPF 0-5 0 - 5 RBC/hpf   WBC, UA NONE SEEN 0 - 5 WBC/hpf   Bacteria, UA MANY (A) NONE SEEN   Squamous Epithelial / LPF NONE SEEN NONE SEEN   Mucous PRESENT    Hyaline Casts, UA PRESENT   Basic metabolic panel     Status: None   Collection Time: 10/16/14 12:59 AM  Result Value Ref Range   Sodium 138 135 - 145 mmol/L   Potassium 4.2 3.5 - 5.1 mmol/L   Chloride 104 101 - 111 mmol/L   CO2 28 22 - 32 mmol/L   Glucose, Bld 83 65 - 99 mg/dL   BUN 15 6 - 20 mg/dL   Creatinine, Ser 0.72 0.44 - 1.00 mg/dL   Calcium 9.3 8.9 - 10.3 mg/dL   GFR calc non Af Amer >60  >60 mL/min   GFR calc Af Amer >60 >60 mL/min    Comment: (NOTE) The eGFR has been calculated using the CKD EPI equation. This calculation has not been validated in all clinical situations. eGFR's persistently <60 mL/min signify possible Chronic Kidney Disease.    Anion gap 6 5 - 15  Troponin I (q 6hr x 3)     Status: None   Collection Time: 10/16/14 12:59 AM  Result Value Ref Range   Troponin I <0.03 <0.031 ng/mL    Comment:        NO INDICATION OF MYOCARDIAL INJURY.   Troponin I (q 6hr x 3)     Status: None   Collection Time: 10/16/14  6:53 AM  Result Value Ref Range   Troponin I <0.03 <0.031 ng/mL    Comment:        NO INDICATION OF MYOCARDIAL INJURY.     No results found.  Review of Systems  Constitutional: Positive for weight loss. Negative for fever, chills, malaise/fatigue and diaphoresis.  HENT: Negative.   Eyes: Negative.   Respiratory: Negative.   Cardiovascular: Negative.   Gastrointestinal: Negative.   Genitourinary: Negative.   Musculoskeletal: Negative.   Skin: Negative.   Neurological: Positive for loss of consciousness. Negative for dizziness, tingling, tremors, sensory change, speech change, focal weakness, seizures and weakness.  Psychiatric/Behavioral: Positive for memory loss. The patient has insomnia.    Blood pressure 164/73, pulse 62, temperature 98.6 F (37 C), temperature source Oral, resp. rate 16, height 5' 5"  (1.651 m), weight 46.267 kg (102 lb), SpO2 100 %. Physical Exam  Nursing note and vitals reviewed. Constitutional: She appears well-developed and well-nourished. No distress.  HENT:  Head: Normocephalic and atraumatic.  Right Ear: External ear normal.  Left Ear: External ear normal.  Nose: Nose normal.  Mouth/Throat: Oropharynx is clear and moist.  Eyes: Conjunctivae and EOM are normal. Pupils are equal, round, and reactive to light. No scleral icterus.  Neck: Normal range of motion. Neck supple.  Cardiovascular:  Normal rate,  regular rhythm, normal heart sounds and intact distal pulses.   No murmur heard. Respiratory: Effort normal and breath sounds normal. No respiratory distress.  GI: Soft. Bowel sounds are normal. She exhibits no distension.  Musculoskeletal: Normal range of motion. She exhibits no edema.  Neurological: She is alert. She displays normal reflexes. No cranial nerve deficit. Coordination normal.  Skin: Skin is warm and dry. She is not diaphoretic.    EEG shows some R temporal sharp waves  Assessment/Plan: 1.  Probable seizure-  Question if these episodes are not provoked by medications and lack of sleep;  Must r/o underlying pathology as well 2.  Insomnia-  Part of normal dementia and needs some sleep cycle regulation 3.  Alheizmers-  Severe but no acute changes -  MRI of brain w/wo contrast to r/o secondary cause -  Start Vimpat 3m BID -  D/c remeron -  Ok to continue trazadone -  Start Seroquel [id number] -  Will likely need melatonin as well but it is unavailable here -  Lights on during day and up in chair, do not disturb at night -  No driving -  Will follow  Mary [name] 10/16/2014, 2:37 PM

## 2014-10-16 NOTE — Progress Notes (Signed)
Pt's skin is warm and dry to touch. Ecchymosis noted to left upper arm and an abrasion to right inner thigh.  Verified by Misty Stanley, RN

## 2014-10-16 NOTE — Care Management (Signed)
Patient presents from [facility].  This is the second episode of losing consciousness i 2 weeks.  Neurology consult indicates that this is probable seizure.  MRI and EEG pending.Facility says if home health is needed agreeable and agency [facility].

## 2014-10-16 NOTE — Clinical Social Work Note (Signed)
FL2 on pt's chart for MD signature 

## 2014-10-17 DIAGNOSIS — R569 Unspecified convulsions: Secondary | ICD-10-CM

## 2014-10-17 LAB — BASIC METABOLIC PANEL
Anion gap: 4 — ABNORMAL LOW (ref 5–15)
BUN: 15 mg/dL (ref 6–20)
CALCIUM: 9 mg/dL (ref 8.9–10.3)
CO2: 29 mmol/L (ref 22–32)
CREATININE: 0.69 mg/dL (ref 0.44–1.00)
Chloride: 106 mmol/L (ref 101–111)
GFR calc Af Amer: >60 mL/min (ref >60)
GLUCOSE: 78 mg/dL (ref 65–99)
POTASSIUM: 3.7 mmol/L (ref 3.5–5.1)
SODIUM: 139 mmol/L (ref 135–145)

## 2014-10-17 LAB — CBC
HCT: 31.6 % — ABNORMAL LOW (ref 35.0–47.0)
Hemoglobin: 10.4 g/dL — ABNORMAL LOW (ref 12.0–16.0)
MCH: 28.5 pg (ref 26.0–34.0)
MCHC: 32.9 g/dL (ref 32.0–36.0)
MCV: 86.7 fL (ref 80.0–100.0)
PLATELETS: 210 10*3/uL (ref 150–440)
RBC: 3.65 MIL/uL — AB (ref 3.80–5.20)
RDW: 15.2 % — AB (ref 11.5–14.5)
WBC: 5 10*3/uL (ref 3.6–11.0)

## 2014-10-17 MED ORDER — LACOSAMIDE 50 MG PO TABS: 50.0000 mg | ORAL_TABLET | Freq: Two times a day (BID) | ORAL | Status: DC

## 2014-10-17 MED ORDER — QUETIAPINE FUMARATE 25 MG PO TABS: 25.0000 mg | ORAL_TABLET | Freq: Every day | ORAL | Status: DC

## 2014-10-17 MED ORDER — POLYMYXIN B-TRIMETHOPRIM 10000-0.1 UNIT/ML-% OP SOLN: 1.0000 [drp] | Freq: Four times a day (QID) | OPHTHALMIC | Status: AC

## 2014-10-17 NOTE — Discharge Summary (Signed)
[facility] - [place] at [facility]  DISCHARGE SUMMARY   PATIENT NAME: Mary [name]    MR#:  [id number]  DATE OF BIRTH:  [birth year]  DATE OF ADMISSION:  10/15/2014 ADMITTING PHYSICIAN: Wyatt Haste, MD  DATE OF DISCHARGE: 10/17/2014  PRIMARY CARE PHYSICIAN: Olevia Perches, DO    ADMISSION DIAGNOSIS:  Syncope, unspecified syncope type [R55]  DISCHARGE DIAGNOSIS:  Principal Problem:   Seizures  SECONDARY DIAGNOSIS:   Past Medical History  Diagnosis Date  . Arthritis   . Hypertension   . Kidney disease   . Frequent falls   . Alzheimer disease     HOSPITAL COURSE:   1) seizure: EEG positive, MRI showing right temporal lobe cyst consistent with a seizure focus seen on EEG. She was followed by neurology during the hospitalization. She was loaded with Vimpat and will continue on that medication orally as an outpatient. She will need to follow-up with neurology in 2-3 months and per Dr. Michaelle Copas recommendations will need an open MRI at that time  #2 hypertension: Blood pressure was well-controlled on home medications no changes made  #3 dementia: Stable. She has been accompanied by her family throughout the hospitalization who reports that she is at her baseline. Continue with Aricept. She will continue to reside at a family care home. She will also have home health nursing for closer monitoring.    DISCHARGE CONDITIONS:   Stable  CONSULTS OBTAINED:     Neurology, Dr. Katrinka Blazing  DRUG ALLERGIES:  No Known Allergies  DISCHARGE MEDICATIONS:   Current Discharge Medication List    START taking these medications   Details  lacosamide (VIMPAT) 50 MG TABS tablet Take 1 tablet (50 mg total) by mouth 2 (two) times daily. Qty: 60 tablet, Refills: 0    QUEtiapine (SEROQUEL) 25 MG tablet Take 1 tablet (25 mg total) by mouth at bedtime. Qty: 30 tablet, Refills: 0    trimethoprim-polymyxin b (POLYTRIM) ophthalmic solution Place 1 drop into the left eye  every 6 (six) hours. Qty: 10 mL, Refills: 0      CONTINUE these medications which have NOT CHANGED   Details  acetaminophen (TYLENOL) 325 MG tablet Take 650 mg by mouth every 6 (six) hours as needed for mild pain or headache.     alum & mag hydroxide-simeth (MAALOX/MYLANTA) 200-200-20 MG/5ML suspension Take 30 mLs by mouth as needed for indigestion or heartburn.     aspirin EC 81 MG tablet Take 81 mg by mouth daily.    bismuth subsalicylate (PEPTO BISMOL) 262 MG/15ML suspension Take 30 mLs by mouth every 6 (six) hours as needed (for GI upset).    Cholecalciferol (VITAMIN D3) 5000 UNITS TABS Take 5,000 Units by mouth daily.    Cranberry 405 MG CAPS Take 1 capsule by mouth 2 (two) times daily.   Associated Diagnoses: History of recurrent UTIs    donepezil (ARICEPT) 10 MG tablet Take 10 mg by mouth at bedtime.    loperamide (IMODIUM A-D) 2 MG tablet Take 2 mg by mouth 4 (four) times daily as needed for diarrhea or loose stools.    losartan (COZAAR) 50 MG tablet Take 1 tablet (50 mg total) by mouth daily. Qty: 90 tablet, Refills: 1    magnesium hydroxide (MILK OF MAGNESIA) 400 MG/5ML suspension Take 30 mLs by mouth daily as needed for mild constipation.     Multiple Vitamin (MULTIVITAMIN WITH MINERALS) TABS tablet Take 1 tablet by mouth daily.    traZODone (DESYREL) 50 MG tablet  Take 50 mg by mouth at bedtime.      STOP taking these medications     mirtazapine (REMERON) 15 MG tablet          DISCHARGE INSTRUCTIONS:    DIET:  Regular diet  DISCHARGE CONDITION:  Good  ACTIVITY:  Activity as tolerated  OXYGEN:  Home Oxygen: No.   Oxygen Delivery: room air  DISCHARGE LOCATION:  group home   If you experience worsening of your admission symptoms, develop shortness of breath, life threatening emergency, suicidal or homicidal thoughts you must seek medical attention immediately by calling 911 or calling your MD immediately  if symptoms less severe.  You Must read  complete instructions/literature along with all the possible adverse reactions/side effects for all the Medicines you take and that have been prescribed to you. Take any new Medicines after you have completely understood and accpet all the possible adverse reactions/side effects.   Please note  You were cared for by a hospitalist during your hospital stay. If you have any questions about your discharge medications or the care you received while you were in the hospital after you are discharged, you can call the unit and asked to speak with the hospitalist on call if the hospitalist that took care of you is not available. Once you are discharged, your primary care physician will handle any further medical issues. Please note that NO REFILLS for any discharge medications will be authorized once you are discharged, as it is imperative that you return to your primary care physician (or establish a relationship with a primary care physician if you do not have one) for your aftercare needs so that they can reassess your need for medications and monitor your lab values.     Today   CHIEF COMPLAINT:   Chief Complaint  Patient presents with  . Loss of Consciousness    HISTORY OF PRESENT ILLNESS:  Mary [name] is a 81 y.o. female with a known history of essential hypertension, Alzheimer's dementia resenting after syncopal episode. Patient unable to provide meaningful information given mental status/medical condition. History obtained from patient's daughter present at bedside. She was in her usual state of health at her nursing facility and they were eating dinner when she passed out. No loss of bowel or bladder function no witnessed seizure activity now back to baseline. Had a syncopal episode approximately 2 weeks ago as well  VITAL SIGNS:  Blood pressure 133/52, pulse 57, temperature 99.3 F (37.4 C), temperature source Oral, resp. rate 16, height 5\' 5"  (1.651 m), weight 46.267 kg (102 lb), SpO2  100 %.  I/O:   Intake/Output Summary (Last 24 hours) at 10/17/14 1434 Last data filed at 10/17/14 0826  Gross per 24 hour  Intake    120 ml  Output      0 ml  Net    120 ml    PHYSICAL EXAMINATION:  GENERAL:  81 y.o.-year-old patient lying in the bed with no acute distress. Thin EYES: Pupils equal, round, reactive to light and accommodation. No scleral icterus. Extraocular muscles intact.  HEENT: Head atraumatic, normocephalic. Oropharynx and nasopharynx clear.  NECK:  Supple, no jugular venous distention. No thyroid enlargement, no tenderness.  LUNGS: Normal breath sounds bilaterally, no wheezing, rales,rhonchi or crepitation. No use of accessory muscles of respiration.  CARDIOVASCULAR: S1, S2 normal. No murmurs, rubs, or gallops.  ABDOMEN: Soft, non-tender, non-distended. Bowel sounds present. No organomegaly or mass.  EXTREMITIES: No pedal edema, cyanosis, or clubbing.  NEUROLOGIC: Cranial nerves  II through XII are intact. Muscle strength 5/5 in all extremities. Sensation intact. Gait not checked.  PSYCHIATRIC: The patient is alert and oriented to person and place  SKIN: No obvious rash, lesion, or ulcer.   DATA REVIEW:   CBC  Recent Labs Lab 10/17/14 0551  WBC 5.0  HGB 10.4*  HCT 31.6*  PLT 210    Chemistries   Recent Labs Lab 10/15/14 2045  10/17/14 0551  NA 137  < > 139  K 4.1  < > 3.7  CL 101  < > 106  CO2 30  < > 29  GLUCOSE 83  < > 78  BUN 16  < > 15  CREATININE 0.76  < > 0.69  CALCIUM 9.1  < > 9.0  AST 41  --   --   ALT 35  --   --   ALKPHOS 64  --   --   BILITOT 0.6  --   --   < > = values in this interval not displayed.  Cardiac Enzymes  Recent Labs Lab 10/16/14 1445  TROPONINI 0.03    Microbiology Results  Results for orders placed or performed in visit on 08/15/14  Microscopic Examination     Status: None   Collection Time: 08/15/14 10:19 AM  Result Value Ref Range Status   WBC, UA 0-5 0 -  5 /hpf Final   RBC, UA 0-2 0 -  2 /hpf  Final   Epithelial Cells (non renal) 0-10 0 - 10 /hpf Final   Bacteria, UA Few None seen/Few Final    RADIOLOGY:  Ct Head Wo Contrast  10/16/2014   CLINICAL DATA:  Seizures.  History of hypertension and Alzheimer's.  EXAM: CT HEAD WITHOUT CONTRAST  TECHNIQUE: Contiguous axial images were obtained from the base of the skull through the vertex without intravenous contrast.  COMPARISON:  02/07/2014  FINDINGS: There is no evidence of mass effect, midline shift, or extra-axial fluid collections. There is no evidence of a space-occupying lesion or intracranial hemorrhage. There is no evidence of a cortical-based area of acute infarction. There is generalized cerebral atrophy. There is periventricular white matter low attenuation likely secondary to microangiopathy.  The ventricles and sulci are appropriate for the patient's age. The basal cisterns are patent.  Visualized portions of the orbits are unremarkable. The visualized portions of the paranasal sinuses and mastoid air cells are unremarkable. Cerebrovascular atherosclerotic calcifications are noted.  The osseous structures are unremarkable.  IMPRESSION: 1.   1.  No acute intracranial pathology. 2. Chronic microvascular disease and cerebral atrophy.   Electronically Signed   By: Elige Ko   On: 10/16/2014 15:26   Mr Brain Ltd W/o Cm  10/16/2014   CLINICAL DATA:  Possible seizure. Two recent syncopal type episodes. History of Alzheimer disease.  EXAM: MRI HEAD WITHOUT CONTRAST  TECHNIQUE: Multiplanar, multiecho pulse sequences of the brain and surrounding structures were obtained without intravenous contrast.  COMPARISON:  Head CT 10/16/2014, 02/07/2014, and 02/06/2014  FINDINGS: The examination had to be discontinued prior to completion due to patient request. Only axial diffusion, sagittal T1, and axial T2 weighted images were obtained.  There is no evidence of acute infarct, gross intracranial hemorrhage, midline shift, or extra-axial fluid collection.  There is mild to moderate generalized cerebral atrophy. Patchy and confluent T2 hyperintensities are present throughout the subcortical and deep cerebral white matter bilaterally corresponding to the hypodensities seen on the prior head CTs. Mild heterogeneous T2 hyperintensity is present in the  left thalamus and brainstem. A tiny, chronic infarct is noted in the left cerebellum. A 6 mm well-circumscribed round T2 hyperintense, T1 hypointense focus is present in the right temporal lobe just lateral to the temporal horn of the ventricle (series 5, image 9).  Prior bilateral cataract extraction is noted. No significant inflammatory disease is seen in the paranasal sinuses or mastoid air cells. Major intracranial vascular flow voids are preserved.  IMPRESSION: 1. Incomplete examination as above. No acute infarct or mass effect. 2. Extensive cerebral white matter disease, nonspecific. 3. 6 mm T2 hyperintense focus in the right temporal lobe, overall benign in appearance and possibly representing a small cyst although incompletely evaluated on this examination. Given the clinical concern for seizure activity, consider reattempting a full brain MRI if the patient would be able to better tolerate the study on a future date.   Electronically Signed   By: Sebastian Ache M.D.   On: 10/16/2014 16:30    EKG:   Orders placed or performed during the hospital encounter of 10/15/14  . ED EKG  . ED EKG  . EKG 12-Lead  . EKG 12-Lead      Management plans discussed with the patient, family and they are in agreement.  CODE STATUS:     Code Status Orders        Start     Ordered   10/15/14 2327  Full code   Continuous     10/15/14 2327      TOTAL TIME TAKING CARE OF THIS PATIENT: 40 minutes.  Greater than 50% of time spent in care coordination and counseling.  Elby Showers M.D on 10/17/2014 at 2:34 PM  Between 7am to 6pm - Pager - [phone]  After 6pm go to [email or web address] - password [email or web address]  [facility]  [place] [facility]  Office  [phone]  CC: Primary care physician; Olevia Perches, DO

## 2014-10-17 NOTE — Progress Notes (Signed)
Iv and telemetry removed, reviewed instructions and home meds with daughter at bedside, f/u appts scheduled, no questions verbalized, packet given to daughter, escorted by staff to visitors entrance

## 2014-10-17 NOTE — Discharge Instructions (Signed)
°  DIET:  Regular diet  DISCHARGE CONDITION:  Good  ACTIVITY:  Activity as tolerated  OXYGEN:  Home Oxygen: No.   Oxygen Delivery: room air  DISCHARGE LOCATION:  group home   If you experience worsening of your admission symptoms, develop shortness of breath, life threatening emergency, suicidal or homicidal thoughts you must seek medical attention immediately by calling 911 or calling your MD immediately  if symptoms less severe.  You Must read complete instructions/literature along with all the possible adverse reactions/side effects for all the Medicines you take and that have been prescribed to you. Take any new Medicines after you have completely understood and accpet all the possible adverse reactions/side effects.   Please note  You were cared for by a hospitalist during your hospital stay. If you have any questions about your discharge medications or the care you received while you were in the hospital after you are discharged, you can call the unit and asked to speak with the hospitalist on call if the hospitalist that took care of you is not available. Once you are discharged, your primary care physician will handle any further medical issues. Please note that NO REFILLS for any discharge medications will be authorized once you are discharged, as it is imperative that you return to your primary care physician (or establish a relationship with a primary care physician if you do not have one) for your aftercare needs so that they can reassess your need for medications and monitor your lab values.

## 2014-10-17 NOTE — Care Management (Signed)
Patient is for discharge back to family care home.  Made referral for nursing to [facility] for neuro follow up and response to meds.  Informed attending of need for home health order/face to face.  Called and faxed referral

## 2014-10-17 NOTE — Progress Notes (Signed)
NEUROLOGY NOTE  S: Pt has no complaints and wants to go home, no more episodes  ROS neg x 8 systems except per HPI  O: 99.3   133*52    57   16 Nl weight, no distress Normocephalic, oropharynx clear Supple, nl JVD CTA B, no wheezing RRR, no murmur No C/C/E  Alert and oriented to person only, nl speech and language PERRLA, EOMI, nl VF, face symmetric 5/5 B  MRI personally reviewed by me and is shows moderate white matter changes and one small R temporal cyst  A/P: 1.  Probable seizure-  Now controlled and appears to come out of R temporal area 2.  R temporal cyst-  Appears benign but could be seizure focus 3.  White matter changes-  Stable and could contribute to dementia -  Continue Vimpat  BID -  Start ASA  daily -  Needs open MRI as outpatient w/wo contrast in 2-3 months -  No driving -  Will sign off, please call with questions -  Needs to f/u with [facility] neuro in 3 months

## 2014-10-17 NOTE — Care Management (Signed)
MRI process was discontinued before completion due to patient request.  There was an area of concern (maybe a cyst) and recommended attempt to repeat if clinically indicated.  Attending has ordered repeat MRI.  This patient is full code.  May benefit to address code status and the efficacy of continued testing. Discussed during progression and will discuss with attending when rounds

## 2014-10-18 ENCOUNTER — Telehealth: Payer: Self-pay | Admitting: Family Medicine

## 2014-10-18 NOTE — Telephone Encounter (Signed)
Please call pt's daughter in regards to pt's recent hospital admission. Just needs clarification. Thanks.

## 2014-10-19 NOTE — Telephone Encounter (Signed)
Pt has a patch on her right inner thigh area, it is a very large patch with a date and signature on it. No one can tell her what the patch is there for. Please inform pt's daughter what this is for. Thanks.

## 2014-10-19 NOTE — Telephone Encounter (Signed)
Called patient's daughter and [facility] for her to call back.

## 2014-10-19 NOTE — Telephone Encounter (Signed)
White patch with some gauze with some tape on it with some tape around it. Does not appear to be any medications in the computer that are patches. Possibly a pressure ulcer, but OK to remove patch. If rest home calls, they can remove patch. Mary [name] with check back in with neurology regarding cause of the seizure. Appointment already made. No other concerns.

## 2014-10-20 ENCOUNTER — Ambulatory Visit: Payer: Medicare Other | Admitting: Family Medicine

## 2014-10-30 ENCOUNTER — Encounter: Payer: Self-pay | Admitting: Family Medicine

## 2014-10-30 ENCOUNTER — Telehealth: Payer: Self-pay | Admitting: Family Medicine

## 2014-10-30 ENCOUNTER — Ambulatory Visit (INDEPENDENT_AMBULATORY_CARE_PROVIDER_SITE_OTHER): Payer: Medicare Other | Admitting: Family Medicine

## 2014-10-30 VITALS — BP 207/78 | HR 60 | Temp 98.4°F | Wt 107.0 lb

## 2014-10-30 DIAGNOSIS — R569 Unspecified convulsions: Secondary | ICD-10-CM

## 2014-10-30 DIAGNOSIS — I129 Hypertensive chronic kidney disease with stage 1 through stage 4 chronic kidney disease, or unspecified chronic kidney disease: Secondary | ICD-10-CM | POA: Diagnosis not present

## 2014-10-30 DIAGNOSIS — F0391 Unspecified dementia with behavioral disturbance: Secondary | ICD-10-CM

## 2014-10-30 NOTE — Assessment & Plan Note (Signed)
BP very labile. Good in the hospital. Will have home take daily for 1 week and send the readings. Will determine then if we should change her medicine.

## 2014-10-30 NOTE — Assessment & Plan Note (Signed)
Continue to follow with neurology.

## 2014-10-30 NOTE — Telephone Encounter (Signed)
Needs a prior auth for her vimpat. She is due to see neurology. We will fill out prior auth, and see how it goes.

## 2014-10-30 NOTE — Progress Notes (Signed)
BP 207/78 mmHg  Pulse 60  Temp(Src) 98.4 F (36.9 C)  Wt 107 lb (48.535 kg)  SpO2 100%   Subjective:    Patient ID: Mary [name], female    DOB: 1922-01-22, 81 y.o.   MRN: [id number]  HPI: Mary [name] is a 81 y.o. female  Chief Complaint  Patient presents with  . 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 Malvin Johns): Changed her medicine to depakote, she will be following with him and checking her levels. All the behavioral changes all got better with treating the seizures. Has been eating and   Relevant past medical, surgical, family and social history reviewed and updated as indicated. Interim medical history since our last visit reviewed. Allergies and medications reviewed and updated.  Review of Systems  Constitutional: Negative.   Respiratory: Negative.   Cardiovascular: Negative.   Gastrointestinal: Negative.   Neurological: Negative.   Psychiatric/Behavioral: Negative.     Per HPI unless specifically indicated above     Objective:    BP 207/78 mmHg  Pulse 60  Temp(Src) 98.4 F (36.9 C)  Wt 107 lb (48.535 kg)  SpO2 100%  Wt Readings from Last 3 Encounters:  10/30/14 107 lb (48.535 kg)  10/16/14 102 lb (46.267 kg)  10/16/14 102 lb (46.267 kg)    Physical Exam  Constitutional: She is oriented to person, place, and time. She appears well-developed and well-nourished. No distress.  HENT:  Head: Normocephalic and atraumatic.  Right Ear: Hearing normal.  Left Ear: Hearing normal.  Nose: Nose normal.  Eyes: Conjunctivae and lids are normal. Right eye exhibits no discharge. Left eye exhibits no discharge. No scleral icterus.   Cardiovascular: Normal rate, regular rhythm and normal heart sounds.  Exam reveals no gallop and no friction rub.   No murmur heard. Pulmonary/Chest: Effort normal and breath sounds normal. No respiratory distress. She has no wheezes. She has no rales. She exhibits no tenderness.  Musculoskeletal: Normal range of motion.  Neurological: She is alert and oriented to person, place, and time.  Skin: Skin is intact. No rash noted.  Psychiatric: She has a normal mood and affect. Her speech is normal and behavior is normal. Judgment and thought content normal. Cognition and memory are normal.  Nursing note and vitals reviewed.   Results for orders placed or performed during the hospital encounter of 10/15/14  CBC  Result Value Ref Range   WBC 7.0 3.6 - 11.0 K/uL   RBC 4.10 3.80 - 5.20 MIL/uL   Hemoglobin 11.4 (L) 12.0 - 16.0 g/dL   HCT 40.9 81.1 - 91.4 %   MCV 85.3 80.0 - 100.0 fL   MCH 27.8 26.0 - 34.0 pg   MCHC 32.6 32.0 - 36.0 g/dL   RDW 78.2 (H) 95.6 - 21.3 %   Platelets 254 150 - 440 K/uL  Urinalysis complete, with microscopic ([facility] only)  Result Value Ref Range   Color, Urine YELLOW (A) YELLOW   APPearance CLEAR (A) CLEAR   Glucose, UA NEGATIVE NEGATIVE mg/dL   Bilirubin Urine NEGATIVE NEGATIVE   Ketones, ur NEGATIVE NEGATIVE mg/dL   Specific Gravity, Urine 1.012 1.005 - 1.030   Hgb urine dipstick NEGATIVE NEGATIVE   pH 6.0 5.0 -  8.0   Protein, ur 30 (A) NEGATIVE mg/dL   Nitrite NEGATIVE NEGATIVE   Leukocytes, UA TRACE (A) NEGATIVE   RBC / HPF 0-5 0 - 5 RBC/hpf   WBC, UA NONE SEEN 0 - 5 WBC/hpf   Bacteria, UA MANY (A) NONE SEEN   Squamous Epithelial / LPF NONE SEEN NONE SEEN   Mucous PRESENT    Hyaline Casts, UA PRESENT   Troponin I  Result Value Ref Range   Troponin I <0.03 <0.031 ng/mL  Comprehensive metabolic panel  Result Value Ref Range   Sodium 137 135 - 145 mmol/L   Potassium 4.1 3.5 - 5.1 mmol/L   Chloride 101 101 - 111 mmol/L   CO2 30 22 - 32 mmol/L    Glucose, Bld 83 65 - 99 mg/dL   BUN 16 6 - 20 mg/dL   Creatinine, Ser 1.61 0.44 - 1.00 mg/dL   Calcium 9.1 8.9 - 09.6 mg/dL   Total Protein 5.8 (L) 6.5 - 8.1 g/dL   Albumin 2.8 (L) 3.5 - 5.0 g/dL   AST 41 15 - 41 U/L   ALT 35 14 - 54 U/L   Alkaline Phosphatase 64 38 - 126 U/L   Total Bilirubin 0.6 0.3 - 1.2 mg/dL   GFR calc non Af Amer >60 >60 mL/min   GFR calc Af Amer >60 >60 mL/min   Anion gap 6 5 - 15  Basic metabolic panel  Result Value Ref Range   Sodium 138 135 - 145 mmol/L   Potassium 4.2 3.5 - 5.1 mmol/L   Chloride 104 101 - 111 mmol/L   CO2 28 22 - 32 mmol/L   Glucose, Bld 83 65 - 99 mg/dL   BUN 15 6 - 20 mg/dL   Creatinine, Ser 0.45 0.44 - 1.00 mg/dL   Calcium 9.3 8.9 - 40.9 mg/dL   GFR calc non Af Amer >60 >60 mL/min   GFR calc Af Amer >60 >60 mL/min   Anion gap 6 5 - 15  Troponin I (q 6hr x 3)  Result Value Ref Range   Troponin I <0.03 <0.031 ng/mL  Troponin I (q 6hr x 3)  Result Value Ref Range   Troponin I <0.03 <0.031 ng/mL  Troponin I (q 6hr x 3)  Result Value Ref Range   Troponin I 0.03 <0.031 ng/mL  CBC  Result Value Ref Range   WBC 5.0 3.6 - 11.0 K/uL   RBC 3.65 (L) 3.80 - 5.20 MIL/uL   Hemoglobin 10.4 (L) 12.0 - 16.0 g/dL   HCT 81.1 (L) 91.4 - 78.2 %   MCV 86.7 80.0 - 100.0 fL   MCH 28.5 26.0 - 34.0 pg   MCHC 32.9 32.0 - 36.0 g/dL   RDW 95.6 (H) 21.3 - 08.6 %   Platelets 210 150 - 440 K/uL  Basic metabolic panel  Result Value Ref Range   Sodium 139 135 - 145 mmol/L   Potassium 3.7 3.5 - 5.1 mmol/L   Chloride 106 101 - 111 mmol/L   CO2 29 22 - 32 mmol/L   Glucose, Bld 78 65 - 99 mg/dL   BUN 15 6 - 20 mg/dL   Creatinine, Ser 5.78 0.44 - 1.00 mg/dL   Calcium 9.0 8.9 - 46.9 mg/dL   GFR calc non Af Amer >60 >60 mL/min   GFR calc Af Amer >60 >60 mL/min   Anion gap 4 (L) 5 - 15      Assessment & Plan:   Problem List Items  Addressed This Visit      Nervous and Auditory   Dementia - Primary    So much better! Significantly improved with  treatment of seizures. Continue to follow with neurology. OK to stay in rest home now as behavioral issues have resolved. Continue to monitor.       Relevant Medications   divalproex (DEPAKOTE) 125 MG DR tablet     Genitourinary   Benign hypertensive renal disease    BP very labile. Good in the hospital. Will have home take daily for 1 week and send the readings. Will determine then if we should change her medicine.         Other   Seizures    Continue to follow with neurology.       Relevant Medications   divalproex (DEPAKOTE) 125 MG DR tablet       Follow up plan: Return in about 3 months (around 01/29/2015).

## 2014-10-30 NOTE — Assessment & Plan Note (Addendum)
So much better! Significantly improved with treatment of seizures. Continue to follow with neurology. OK to stay in rest home now as behavioral issues have resolved. Continue to monitor.

## 2014-12-19 ENCOUNTER — Other Ambulatory Visit: Payer: Self-pay | Admitting: Family Medicine

## 2014-12-24 ENCOUNTER — Emergency Department: Payer: Medicare Other

## 2014-12-24 ENCOUNTER — Emergency Department
Admission: EM | Admit: 2014-12-24 | Discharge: 2014-12-24 | Disposition: A | Discharge status: Home / Self Care | Payer: Medicare Other | Attending: Emergency Medicine | Admitting: Emergency Medicine

## 2014-12-24 ENCOUNTER — Encounter: Payer: Self-pay | Admitting: Emergency Medicine

## 2014-12-24 DIAGNOSIS — Y998 Other external cause status: Secondary | ICD-10-CM | POA: Insufficient documentation

## 2014-12-24 DIAGNOSIS — Y9289 Other specified places as the place of occurrence of the external cause: Secondary | ICD-10-CM | POA: Insufficient documentation

## 2014-12-24 DIAGNOSIS — S0083XA Contusion of other part of head, initial encounter: Secondary | ICD-10-CM | POA: Diagnosis not present

## 2014-12-24 DIAGNOSIS — Z7982 Long term (current) use of aspirin: Secondary | ICD-10-CM | POA: Insufficient documentation

## 2014-12-24 DIAGNOSIS — N390 Urinary tract infection, site not specified: Secondary | ICD-10-CM | POA: Diagnosis not present

## 2014-12-24 DIAGNOSIS — R4182 Altered mental status, unspecified: Secondary | ICD-10-CM | POA: Diagnosis present

## 2014-12-24 DIAGNOSIS — Y9302 Activity, running: Secondary | ICD-10-CM | POA: Insufficient documentation

## 2014-12-24 DIAGNOSIS — Z79899 Other long term (current) drug therapy: Secondary | ICD-10-CM | POA: Diagnosis not present

## 2014-12-24 DIAGNOSIS — S0990XA Unspecified injury of head, initial encounter: Secondary | ICD-10-CM | POA: Diagnosis not present

## 2014-12-24 DIAGNOSIS — R410 Disorientation, unspecified: Secondary | ICD-10-CM | POA: Insufficient documentation

## 2014-12-24 DIAGNOSIS — I10 Essential (primary) hypertension: Secondary | ICD-10-CM | POA: Diagnosis not present

## 2014-12-24 DIAGNOSIS — W2209XA Striking against other stationary object, initial encounter: Secondary | ICD-10-CM | POA: Insufficient documentation

## 2014-12-24 LAB — URINALYSIS COMPLETE WITH MICROSCOPIC (ARMC ONLY)
Bilirubin Urine: NEGATIVE
Glucose, UA: NEGATIVE mg/dL
HGB URINE DIPSTICK: NEGATIVE
Ketones, ur: NEGATIVE mg/dL
NITRITE: NEGATIVE
PH: 6 (ref 5.0–8.0)
PROTEIN: 30 mg/dL — AB
SPECIFIC GRAVITY, URINE: 1.015 (ref 1.005–1.030)

## 2014-12-24 LAB — COMPREHENSIVE METABOLIC PANEL
ALBUMIN: 3 g/dL — AB (ref 3.5–5.0)
ALT: 47 U/L (ref 14–54)
AST: 52 U/L — AB (ref 15–41)
Alkaline Phosphatase: 98 U/L (ref 38–126)
Anion gap: 7 (ref 5–15)
BUN: 13 mg/dL (ref 6–20)
CHLORIDE: 98 mmol/L — AB (ref 101–111)
CO2: 32 mmol/L (ref 22–32)
CREATININE: 0.7 mg/dL (ref 0.44–1.00)
Calcium: 9.5 mg/dL (ref 8.9–10.3)
GFR calc Af Amer: >60 mL/min (ref >60)
GFR calc non Af Amer: >60 mL/min (ref >60)
Glucose, Bld: 106 mg/dL — ABNORMAL HIGH (ref 65–99)
Potassium: 4.2 mmol/L (ref 3.5–5.1)
SODIUM: 137 mmol/L (ref 135–145)
TOTAL PROTEIN: 7.3 g/dL (ref 6.5–8.1)
Total Bilirubin: 0.3 mg/dL (ref 0.3–1.2)

## 2014-12-24 LAB — CBC
HEMATOCRIT: 39.3 % (ref 35.0–47.0)
HEMOGLOBIN: 12.5 g/dL (ref 12.0–16.0)
MCH: 27.3 pg (ref 26.0–34.0)
MCHC: 31.7 g/dL — ABNORMAL LOW (ref 32.0–36.0)
MCV: 86.1 fL (ref 80.0–100.0)
Platelets: 216 10*3/uL (ref 150–440)
RBC: 4.57 MIL/uL (ref 3.80–5.20)
RDW: 14.9 % — AB (ref 11.5–14.5)
WBC: 5.3 10*3/uL (ref 3.6–11.0)

## 2014-12-24 MED ORDER — CEPHALEXIN 500 MG PO CAPS
500.0000 mg | ORAL_CAPSULE | Freq: Once | ORAL | Status: AC
Start: 2014-12-24 — End: 2014-12-24
  Administered 2014-12-24: 500 mg via ORAL

## 2014-12-24 MED ORDER — CEPHALEXIN 500 MG PO CAPS: 500.0000 mg | ORAL_CAPSULE | Freq: Two times a day (BID) | ORAL | Status: DC

## 2014-12-24 MED ORDER — CEPHALEXIN 500 MG PO CAPS
ORAL_CAPSULE | ORAL | Status: AC
  Filled 2014-12-24: qty 1

## 2014-12-24 NOTE — Discharge Instructions (Signed)
Please take your antibiotics as prescribed for their entire course. Return to the emergency department for any abdominal pain, fever, worsening confusion, or any other symptom personally concerning to her self. Otherwise please follow-up with her primary care physician in the next 1-2 days for recheck.   Urinary Tract Infection A urinary tract infection (UTI) can occur any place along the urinary tract. The tract includes the kidneys, ureters, bladder, and urethra. A type of germ called bacteria often causes a UTI. UTIs are often helped with antibiotic medicine.  HOME CARE   If given, take antibiotics as told by your doctor. Finish them even if you start to feel better.  Drink enough fluids to keep your pee (urine) clear or pale yellow.  Avoid tea, drinks with caffeine, and bubbly (carbonated) drinks.  Pee often. Avoid holding your pee in for a long time.  Pee before and after having sex (intercourse).  Wipe from front to back after you poop (bowel movement) if you are a woman. Use each tissue only once. GET HELP RIGHT AWAY IF:   You have back pain.  You have lower belly (abdominal) pain.  You have chills.  You feel sick to your stomach (nauseous).  You throw up (vomit).  Your burning or discomfort with peeing does not go away.  You have a fever.  Your symptoms are not better in 3 days. MAKE SURE YOU:   Understand these instructions.  Will watch your condition.  Will get help right away if you are not doing well or get worse.   This information is not intended to replace advice given to you by your health care provider. Make sure you discuss any questions you have with your health care provider.   Document Released: 07/23/2007 Document Revised: 02/24/2014 Document Reviewed: 09/04/2011 Elsevier Interactive Patient Education [employer]2016 [employer]

## 2014-12-24 NOTE — ED Notes (Signed)
[facility] with possible [facility]  Care giver states she hit her head this am  And acting different. Family at bedside

## 2014-12-24 NOTE — ED Provider Notes (Signed)
[facility] Emergency Department Provider Note  Time seen: 2:46 PM  I have reviewed the triage vital signs and the nursing notes.   HISTORY  Chief Complaint Altered Mental Status; Head Injury; and Urinary Tract Infection    HPI Mary [name] is a 81 y.o. female with a past medical history of hypertension, arthritis, Alzheimer's who presents the emergency department with a head injury and confusion. According to the patient's caregiver and daughter the patient has been somewhat more confused over the past several days. States she keeps having to go to the bathroom stating she has to urinate, but only small amount of urine comes out.  They state this is very common for her when she has a urinary tract infection. They also state today she ran into a glass door hitting her head. Patient does have a small hematoma to the right forehead. Denied any loss of consciousness. Denies nausea or vomiting. Patient does have a history of dementia, and mild confusion is normal.     Past Medical History  Diagnosis Date  . Arthritis   . Hypertension   . Kidney disease   . Frequent falls   . Alzheimer disease   . Seizure [facility])     Patient Active Problem List   Diagnosis Date Noted  . Seizures (HCC) 10/17/2014  . Elevated LFTs 08/16/2014  . Dementia 08/15/2014  . Insomnia 08/15/2014  . Benign hypertensive renal disease 08/15/2014    Past Surgical History  Procedure Laterality Date  . Tubal ligation      Current Outpatient Rx  Name  Route  Sig  Dispense  Refill  . acetaminophen (TYLENOL) 325 MG tablet   Oral   Take 650 mg by mouth every 6 (six) hours as needed for mild pain or headache.          Marland Kitchen alum & mag hydroxide-simeth (MAALOX/MYLANTA) 200-200-20 MG/5ML suspension   Oral   Take 30 mLs by mouth as needed for indigestion or heartburn.          Marland Kitchen aspirin EC 81 MG tablet   Oral   Take 81 mg by mouth daily.         Marland Kitchen bismuth subsalicylate (PEPTO  BISMOL) 262 MG/15ML suspension   Oral   Take 30 mLs by mouth every 6 (six) hours as needed (for GI upset).         . Cholecalciferol (VITAMIN D3) 5000 UNITS TABS   Oral   Take 5,000 Units by mouth daily.         . Cranberry 405 MG CAPS   Oral   Take 1 capsule by mouth 2 (two) times daily.         . divalproex (DEPAKOTE) 125 MG DR tablet   Oral   Take 125 mg by mouth 2 (two) times daily.         Marland Kitchen donepezil (ARICEPT) 10 MG tablet   Oral   Take 10 mg by mouth at bedtime.         Marland Kitchen loperamide (IMODIUM A-D) 2 MG tablet   Oral   Take 2 mg by mouth 4 (four) times daily as needed for diarrhea or loose stools.         Marland Kitchen losartan (COZAAR) 50 MG tablet      TAKE 1 TABLET BY MOUTH ONCE DAILY FOR BP   90 tablet   1   . magnesium hydroxide (MILK OF MAGNESIA) 400 MG/5ML suspension   Oral   Take 30  mLs by mouth daily as needed for mild constipation.          . Multiple Vitamin (MULTIVITAMIN) tablet   Oral   Take 1 tablet by mouth daily.         . traZODone (DESYREL) 50 MG tablet   Oral   Take 50 mg by mouth at bedtime.           Allergies Review of patient's allergies indicates no known allergies.  Family History  Problem Relation Age of Onset  . Hypertension Mother   . Hypertension Father   . Stroke Mother   . Stroke Sister   . Heart attack Father   . Heart disease Mother   . Diabetes Sister     Social History Social History  Substance Use Topics  . Smoking status: Never Smoker   . Smokeless tobacco: Never Used  . Alcohol Use: No    Review of Systems Constitutional: Negative for fever. Cardiovascular: Negative for chest pain. Respiratory: Negative for shortness of breath. Gastrointestinal: Negative for abdominal pain Genitourinary: Negative for dysuria. Positive urinary frequency Neurological: Negative for headache 10-point ROS otherwise negative.  ____________________________________________   PHYSICAL EXAM:  VITAL SIGNS: ED Triage  Vitals  Enc Vitals Group     BP 12/24/14 1317 155/94 mmHg     Pulse Rate 12/24/14 1317 63     Resp 12/24/14 1317 20     Temp 12/24/14 1317 98.2 F (36.8 C)     Temp Source 12/24/14 1317 Oral     SpO2 12/24/14 1317 96 %     Weight 12/24/14 1317 105 lb (47.628 kg)     Height 12/24/14 1317  (1.626 m)     Head Cir --      Peak Flow --      Pain Score --      Pain Loc --      Pain Edu? --      Excl. in GC? --     Constitutional: Alert. Well appearing, no distress. Eyes: Normal exam ENT   Head: Small hematoma to right forehead.   Mouth/Throat: Mucous membranes are moist. Cardiovascular: Normal rate, regular rhythm. No murmur Respiratory: Normal respiratory effort without tachypnea nor retractions. Breath sounds are clear and equal bilaterally. No wheezes/rales/rhonchi. Gastrointestinal: Soft and nontender. No distention.   Musculoskeletal: Nontender with normal range of motion in all extremities. Neurologic:  Normal speech and language. No gross focal neurologic deficits are appreciated. Speech is normal. Equal grip strengths. Skin:  Skin is warm, dry and intact.  Psychiatric: Mood and affect are normal. Speech and behavior are normal.   ____________________________________________     RADIOLOGY  CT head shows no acute findings.  ____________________________________________    INITIAL IMPRESSION / ASSESSMENT AND PLAN / ED COURSE  Pertinent labs & imaging results that were available during my care of the patient were reviewed by me and considered in my medical decision making (see chart for details).  Patient presents to the emergency department with a mild increase in confusion, along with a head injury today. Patient's urine does appear to show a urinary tract infection with 2+ leukocyte esterase, 6-30 WBC. We will treat, and send a urine culture to verify. Given the head injury we'll proceed with a CT scan to further evaluate. Currently the patient appears very  well, calm, cooperative, pleasant, with a very normal physical exam.  Urinalysis consistent with a urinary tract infection. A urine culture has been sent. CT shows no acute abnormality. We'll  discharge the patient on Keflex twice daily for the next 7 days. Patient is follow up with a primary care physician. ____________________________________________   FINAL CLINICAL IMPRESSION(S) / ED DIAGNOSES  Urinary tract infection   Minna AntisKevin Bre Pecina, MD 12/24/14 1535

## 2014-12-24 NOTE — ED Notes (Signed)
Pt presents to the ER with family for bump on right forehead when she walked into a window. Family states "she has been acting strangely"; "fidgety and all over the place". Pt is alert and calm at present.Had strong hx of UTI.

## 2014-12-26 LAB — URINE CULTURE

## 2015-01-11 ENCOUNTER — Emergency Department: Payer: Medicare Other

## 2015-01-11 ENCOUNTER — Encounter: Payer: Self-pay | Admitting: Emergency Medicine

## 2015-01-11 ENCOUNTER — Emergency Department
Admission: EM | Admit: 2015-01-11 | Discharge: 2015-01-11 | Disposition: A | Discharge status: Home / Self Care | Payer: Medicare Other | Attending: Emergency Medicine | Admitting: Emergency Medicine

## 2015-01-11 DIAGNOSIS — S0990XA Unspecified injury of head, initial encounter: Secondary | ICD-10-CM

## 2015-01-11 DIAGNOSIS — Z792 Long term (current) use of antibiotics: Secondary | ICD-10-CM | POA: Diagnosis not present

## 2015-01-11 DIAGNOSIS — Y9389 Activity, other specified: Secondary | ICD-10-CM | POA: Insufficient documentation

## 2015-01-11 DIAGNOSIS — Z23 Encounter for immunization: Secondary | ICD-10-CM | POA: Diagnosis not present

## 2015-01-11 DIAGNOSIS — Z79899 Other long term (current) drug therapy: Secondary | ICD-10-CM | POA: Insufficient documentation

## 2015-01-11 DIAGNOSIS — W01198A Fall on same level from slipping, tripping and stumbling with subsequent striking against other object, initial encounter: Secondary | ICD-10-CM | POA: Diagnosis not present

## 2015-01-11 DIAGNOSIS — Y998 Other external cause status: Secondary | ICD-10-CM | POA: Diagnosis not present

## 2015-01-11 DIAGNOSIS — Z7982 Long term (current) use of aspirin: Secondary | ICD-10-CM | POA: Insufficient documentation

## 2015-01-11 DIAGNOSIS — Y92002 Bathroom of unspecified non-institutional (private) residence single-family (private) house as the place of occurrence of the external cause: Secondary | ICD-10-CM | POA: Diagnosis not present

## 2015-01-11 DIAGNOSIS — S01111A Laceration without foreign body of right eyelid and periocular area, initial encounter: Secondary | ICD-10-CM | POA: Insufficient documentation

## 2015-01-11 DIAGNOSIS — I10 Essential (primary) hypertension: Secondary | ICD-10-CM | POA: Insufficient documentation

## 2015-01-11 MED ORDER — TETANUS-DIPHTHERIA TOXOIDS TD 5-2 LFU IM INJ
0.5000 mL | INJECTION | Freq: Once | INTRAMUSCULAR | Status: AC
  Administered 2015-01-11: 0.5 mL via INTRAMUSCULAR
  Filled 2015-01-11: qty 0.5

## 2015-01-11 NOTE — ED Notes (Signed)
Pt's family member states pt resides at an assistive living facility and she went to the bathroom unassisted and fell hitting right side of head and mouth, pt has laceration above right eye and swelling to mouth, unknown if any LOC

## 2015-01-11 NOTE — Discharge Instructions (Signed)
Concussion, Adult °A concussion, or closed-head injury, is a brain injury caused by a direct blow to the head or by a quick and sudden movement (jolt) of the head or neck. Concussions are usually not life-threatening. Even so, the effects of a concussion can be serious. If you have had a concussion before, you are more likely to experience concussion-like symptoms after a direct blow to the head.  °CAUSES °· Direct blow to the head, such as from running into another player during a soccer game, being hit in a fight, or hitting your head on a hard surface. °· A jolt of the head or neck that causes the brain to move back and forth inside the skull, such as in a car crash. °SIGNS AND SYMPTOMS °The signs of a concussion can be hard to notice. Early on, they may be missed by you, family members, and health care providers. You may look fine but act or feel differently. °Symptoms are usually temporary, but they may last for days, weeks, or even longer. Some symptoms may appear right away while others may not show up for hours or days. Every head injury is different. Symptoms include: °· Mild to moderate headaches that will not go away. °· A feeling of pressure inside your head. °· Having more trouble than usual: °¨ Learning or remembering things you have heard. °¨ Answering questions. °¨ Paying attention or concentrating. °¨ Organizing daily tasks. °¨ Making decisions and solving problems. °· Slowness in thinking, acting or reacting, speaking, or reading. °· Getting lost or being easily confused. °· Feeling tired all the time or lacking energy (fatigued). °· Feeling drowsy. °· Sleep disturbances. °¨ Sleeping more than usual. °¨ Sleeping less than usual. °¨ Trouble falling asleep. °¨ Trouble sleeping (insomnia). °· Loss of balance or feeling lightheaded or dizzy. °· Nausea or vomiting. °· Numbness or tingling. °· Increased sensitivity to: °¨ Sounds. °¨ Lights. °¨ Distractions. °· Vision problems or eyes that tire  easily. °· Diminished sense of taste or smell. °· Ringing in the ears. °· Mood changes such as feeling sad or anxious. °· Becoming easily irritated or angry for little or no reason. °· Lack of motivation. °· Seeing or hearing things other people do not see or hear (hallucinations). °DIAGNOSIS °Your health care provider can usually diagnose a concussion based on a description of your injury and symptoms. He or she will ask whether you passed out (lost consciousness) and whether you are having trouble remembering events that happened right before and during your injury. °Your evaluation might include: °· A brain scan to look for signs of injury to the brain. Even if the test shows no injury, you may still have a concussion. °· Blood tests to be sure other problems are not present. °TREATMENT °· Concussions are usually treated in an emergency department, in urgent care, or at a clinic. You may need to stay in the hospital overnight for further treatment. °· Tell your health care provider if you are taking any medicines, including prescription medicines, over-the-counter medicines, and natural remedies. Some medicines, such as blood thinners (anticoagulants) and aspirin, may increase the chance of complications. Also tell your health care provider whether you have had alcohol or are taking illegal drugs. This information may affect treatment. °· Your health care provider will send you home with important instructions to follow. °· How fast you will recover from a concussion depends on many factors. These factors include how severe your concussion is, what part of your brain was injured,   your age, and how healthy you were before the concussion. °· Most people with mild injuries recover fully. Recovery can take time. In general, recovery is slower in older persons. Also, persons who have had a concussion in the past or have other medical problems may find that it takes longer to recover from their current injury. °HOME  CARE INSTRUCTIONS °General Instructions °· Carefully follow the directions your health care provider gave you. °· Only take over-the-counter or prescription medicines for pain, discomfort, or fever as directed by your health care provider. °· Take only those medicines that your health care provider has approved. °· Do not drink alcohol until your health care provider says you are well enough to do so. Alcohol and certain other drugs may slow your recovery and can put you at risk of further injury. °· If it is harder than usual to remember things, write them down. °· If you are easily distracted, try to do one thing at a time. For example, do not try to watch TV while fixing dinner. °· Talk with family members or close friends when making important decisions. °· Keep all follow-up appointments. Repeated evaluation of your symptoms is recommended for your recovery. °· Watch your symptoms and tell others to do the same. Complications sometimes occur after a concussion. Older adults with a brain injury may have a higher risk of serious complications, such as a blood clot on the brain. °· Tell your teachers, school nurse, school counselor, coach, athletic trainer, or work manager about your injury, symptoms, and restrictions. Tell them about what you can or cannot do. They should watch for: °¨ Increased problems with attention or concentration. °¨ Increased difficulty remembering or learning new information. °¨ Increased time needed to complete tasks or assignments. °¨ Increased irritability or decreased ability to cope with stress. °¨ Increased symptoms. °· Rest. Rest helps the brain to heal. Make sure you: °¨ Get plenty of sleep at night. Avoid staying up late at night. °¨ Keep the same bedtime hours on weekends and weekdays. °¨ Rest during the day. Take daytime naps or rest breaks when you feel tired. °· Limit activities that require a lot of thought or concentration. These include: °¨ Doing homework or job-related  work. °¨ Watching TV. °¨ Working on the computer. °· Avoid any situation where there is potential for another head injury (football, hockey, soccer, basketball, martial arts, downhill snow sports and horseback riding). Your condition will get worse every time you experience a concussion. You should avoid these activities until you are evaluated by the appropriate follow-up health care providers. °Returning To Your Regular Activities °You will need to return to your normal activities slowly, not all at once. You must give your body and brain enough time for recovery. °· Do not return to sports or other athletic activities until your health care provider tells you it is safe to do so. °· Ask your health care provider when you can drive, ride a bicycle, or operate heavy machinery. Your ability to react may be slower after a brain injury. Never do these activities if you are dizzy. °· Ask your health care provider about when you can return to work or school. °Preventing Another Concussion °It is very important to avoid another brain injury, especially before you have recovered. In rare cases, another injury can lead to permanent brain damage, brain swelling, or death. The risk of this is greatest during the first 7-10 days after a head injury. Avoid injuries by: °· Wearing a   seat belt when riding in a car. °· Drinking alcohol only in moderation. °· Wearing a helmet when biking, skiing, skateboarding, skating, or doing similar activities. °· Avoiding activities that could lead to a second concussion, such as contact or recreational sports, until your health care provider says it is okay. °· Taking safety measures in your home. °¨ Remove clutter and tripping hazards from floors and stairways. °¨ Use grab bars in bathrooms and handrails by stairs. °¨ Place non-slip mats on floors and in bathtubs. °¨ Improve lighting in dim areas. °SEEK MEDICAL CARE IF: °· You have increased problems paying attention or  concentrating. °· You have increased difficulty remembering or learning new information. °· You need more time to complete tasks or assignments than before. °· You have increased irritability or decreased ability to cope with stress. °· You have more symptoms than before. °Seek medical care if you have any of the following symptoms for more than 2 weeks after your injury: °· Lasting (chronic) headaches. °· Dizziness or balance problems. °· Nausea. °· Vision problems. °· Increased sensitivity to noise or light. °· Depression or mood swings. °· Anxiety or irritability. °· Memory problems. °· Difficulty concentrating or paying attention. °· Sleep problems. °· Feeling tired all the time. °SEEK IMMEDIATE MEDICAL CARE IF: °· You have severe or worsening headaches. These may be a sign of a blood clot in the brain. °· You have weakness (even if only in one hand, leg, or part of the face). °· You have numbness. °· You have decreased coordination. °· You vomit repeatedly. °· You have increased sleepiness. °· One pupil is larger than the other. °· You have convulsions. °· You have slurred speech. °· You have increased confusion. This may be a sign of a blood clot in the brain. °· You have increased restlessness, agitation, or irritability. °· You are unable to recognize people or places. °· You have neck pain. °· It is difficult to wake you up. °· You have unusual behavior changes. °· You lose consciousness. °MAKE SURE YOU: °· Understand these instructions. °· Will watch your condition. °· Will get help right away if you are not doing well or get worse. °  °This information is not intended to replace advice given to you by your health care provider. Make sure you discuss any questions you have with your health care provider. °  °Document Released: 04/26/2003 Document Revised: 02/24/2014 Document Reviewed: 08/26/2012 °Elsevier Interactive Patient Education ©2016 [employer] ° °Please return immediately if condition worsens.  Please contact her primary physician or the physician you were given for referral. If you have any specialist physicians involved in her treatment and plan please also contact them. Thank you for using [facility]. ° °

## 2015-01-11 NOTE — ED Notes (Signed)
Pt discharged home after verbalizing understanding of discharge instructions; nad noted. 

## 2015-01-11 NOTE — ED Provider Notes (Signed)
Time Seen: Approximately ----------------------------------------- 3:39 PM on 01/11/2015 -----------------------------------------    I have reviewed the triage notes  Chief Complaint: Fall and Head Injury   History of Present Illness: Mary [name] is a 81 y.o. female who presents after a non-syncopal fall today. I spoke to the witness of the fall and states that she went to the bathroom and apparently slipped and fell into the shower. The patient and witnesses state that there was no loss of consciousness. Neither party is aware of when her last tetanus shot was. Patient has history of Alzheimer's dementia. Her mental status seems to be at baseline at this point. No other physical complaints.   Past Medical History  Diagnosis Date  . Arthritis   . Hypertension   . Kidney disease   . Frequent falls   . Alzheimer disease   . Seizure [facility])     Patient Active Problem List   Diagnosis Date Noted  . Seizures (HCC) 10/17/2014  . Elevated LFTs 08/16/2014  . Dementia 08/15/2014  . Insomnia 08/15/2014  . Benign hypertensive renal disease 08/15/2014    Past Surgical History  Procedure Laterality Date  . Tubal ligation      Past Surgical History  Procedure Laterality Date  . Tubal ligation      Current Outpatient Rx  Name  Route  Sig  Dispense  Refill  . acetaminophen (TYLENOL) 325 MG tablet   Oral   Take 650 mg by mouth every 6 (six) hours as needed for mild pain or headache.          Marland Kitchen alum & mag hydroxide-simeth (MAALOX/MYLANTA) 200-200-20 MG/5ML suspension   Oral   Take 30 mLs by mouth as needed for indigestion or heartburn.          Marland Kitchen aspirin EC 81 MG tablet   Oral   Take 81 mg by mouth daily.         Marland Kitchen bismuth subsalicylate (PEPTO BISMOL) 262 MG/15ML suspension   Oral   Take 30 mLs by mouth every 6 (six) hours as needed (for GI upset).         . cephALEXin (KEFLEX) 500 MG capsule   Oral   Take 1 capsule (500 mg total) by mouth 2 (two) times  daily.   14 capsule   0   . Cholecalciferol (VITAMIN D3) 5000 UNITS TABS   Oral   Take 5,000 Units by mouth daily.         . Cranberry 405 MG CAPS   Oral   Take 1 capsule by mouth 2 (two) times daily.         . divalproex (DEPAKOTE) 125 MG DR tablet   Oral   Take 125 mg by mouth 2 (two) times daily.         Marland Kitchen donepezil (ARICEPT) 10 MG tablet   Oral   Take 10 mg by mouth at bedtime.         Marland Kitchen loperamide (IMODIUM A-D) 2 MG tablet   Oral   Take 2 mg by mouth 4 (four) times daily as needed for diarrhea or loose stools.         Marland Kitchen losartan (COZAAR) 50 MG tablet      TAKE 1 TABLET BY MOUTH ONCE DAILY FOR BP   90 tablet   1   . magnesium hydroxide (MILK OF MAGNESIA) 400 MG/5ML suspension   Oral   Take 30 mLs by mouth daily as needed for mild constipation.          Marland Kitchen  Multiple Vitamin (MULTIVITAMIN) tablet   Oral   Take 1 tablet by mouth daily.         . traZODone (DESYREL) 50 MG tablet   Oral   Take 50 mg by mouth at bedtime.           Allergies:  Review of patient's allergies indicates no known allergies.  Family History: Family History  Problem Relation Age of Onset  . Hypertension Mother   . Hypertension Father   . Stroke Mother   . Stroke Sister   . Heart attack Father   . Heart disease Mother   . Diabetes Sister     Social History: Social History  Substance Use Topics  . Smoking status: Never Smoker   . Smokeless tobacco: Never Used  . Alcohol Use: No     Review of Systems:   10 point review of systems was performed and was otherwise negative:  Constitutional: No fever Eyes: No visual disturbances. Patient has a small laceration right eyebrow region she states this is where most of her discomfort is. She denies any changes in her vision. ENT: No sore throat, ear pain Cardiac: No chest pain Respiratory: No shortness of breath, wheezing, or stridor Abdomen: No abdominal pain, no vomiting, No diarrhea Endocrine: No weight loss, No  night sweats Extremities: No peripheral edema, cyanosis Skin: No rashes, easy bruising Neurologic: No focal weakness, trouble with speech or swollowing Urologic: No dysuria, Hematuria, or urinary frequency *Patient denies any neck, thoracic, lumbar spine pain  Physical Exam:  ED Triage Vitals  Enc Vitals Group     BP 01/11/15 1509 194/68 mmHg     Pulse Rate 01/11/15 1509 52     Resp 01/11/15 1509 18     Temp 01/11/15 1509 97.9 F (36.6 C)     Temp Source 01/11/15 1509 Oral     SpO2 01/11/15 1509 100 %     Weight 01/11/15 1509 110 lb (49.896 kg)     Height 01/11/15 1509 5' (1.524 m)     Head Cir --      Peak Flow --      Pain Score --      Pain Loc --      Pain Edu? --      Excl. in GC? --     General: Awake , Alert , and Oriented times 3; GCS 15 Head: Patient has a contusion right eyebrow region with mild crush injury and superficial laceration. The skull table is stable. There is no crepitus or step-off noted. Face is stable with good dental alignment. Patient has some mild crush injury to her lower lip. No active bleeding no dental instability. Eyes: Pupils equal , round, reactive to light Nose/Throat: No nasal drainage, patent upper airway without erythema or exudate.  Neck: Supple, Full range of motion, No anterior adenopathy or palpable thyroid masses Lungs: Clear to ascultation without wheezes , rhonchi, or rales Heart: Regular rate, regular rhythm without murmurs , gallops , or rubs Abdomen: Soft, non tender without rebound, guarding , or rigidity; bowel sounds positive and symmetric in all 4 quadrants. No organomegaly .        Extremities: 2 plus symmetric pulses. No edema, clubbing or cyanosis Neurologic: normal ambulation, Motor symmetric without deficits, sensory intact Skin: warm, dry, no rashes   Radiology:  CLINICAL DATA: Pain following fall  EXAM: CT HEAD WITHOUT CONTRAST  TECHNIQUE: Contiguous axial images were obtained from the base of the  skull through the vertex without  intravenous contrast.  COMPARISON: Head CT December 24, 2014 ; brain MRI October 16, 2014  FINDINGS: There is mild diffuse atrophy. There is no intracranial mass hemorrhage, extra-axial fluid collection, or midline shift. There is patchy small vessel disease in the centra semiovale bilaterally. Elsewhere gray-white compartments appear normal. No acute infarct is evident on this study.  There is a scalp hematoma over the inferior right frontal bone extending to the medial preseptal orbital region. No intraorbital lesion is appreciable. The bony calvarium appears intact. The mastoid air cells are clear. There is mucosal thickening in several ethmoid air cells bilaterally.  IMPRESSION: Atrophy with periventricular small vessel disease, stable. No intracranial mass, hemorrhage, extra-axial fluid collection. No acute infarct.  Right periorbital and inferior frontal scalp hematoma. No fracture identified. Mucosal thickening is noted in several ethmoid air cells bilaterally.      I personally reviewed the radiologic studies    ED Course:  Patient's stay here was uneventful and she is back to her baseline mental status. Head CT shows no significant intracranial injury such as subdural epidural hematoma, or cerebral contusion. His wounds were cleaned and dressed by the nursing staff did not require suture realignment. Patient status of her tetanus was updated   Assessment:  Acute closed head injury    Plan: * Outpatient management Patient was advised to return immediately if condition worsens. Patient was advised to follow up with her primary care physician or other specialized physicians involved and in their current assessment.           Jennye MoccasinBrian S Jahaan Vanwagner, MD 01/11/15 [phone]

## 2015-01-23 ENCOUNTER — Encounter: Payer: Self-pay | Admitting: Family Medicine

## 2015-01-23 ENCOUNTER — Ambulatory Visit (INDEPENDENT_AMBULATORY_CARE_PROVIDER_SITE_OTHER): Payer: Medicare Other | Admitting: Family Medicine

## 2015-01-23 VITALS — BP 122/58 | HR 62 | Temp 97.8°F | Wt 99.6 lb

## 2015-01-23 DIAGNOSIS — I129 Hypertensive chronic kidney disease with stage 1 through stage 4 chronic kidney disease, or unspecified chronic kidney disease: Secondary | ICD-10-CM | POA: Diagnosis not present

## 2015-01-23 DIAGNOSIS — E46 Unspecified protein-calorie malnutrition: Secondary | ICD-10-CM | POA: Insufficient documentation

## 2015-01-23 DIAGNOSIS — N3000 Acute cystitis without hematuria: Secondary | ICD-10-CM

## 2015-01-23 LAB — MICROSCOPIC EXAMINATION

## 2015-01-23 MED ORDER — NITROFURANTOIN MONOHYD MACRO 100 MG PO CAPS: 100.0000 mg | ORAL_CAPSULE | Freq: Two times a day (BID) | ORAL | Status: DC

## 2015-01-23 NOTE — Assessment & Plan Note (Signed)
Will increase ensure to TID. Will check back in in 1 month for weight check. Possibly due to coming off her remeron. If continuing to have her weight go down, will discuss restarting remeron with neurology.

## 2015-01-23 NOTE — Assessment & Plan Note (Signed)
BP very labile. Better on recheck. More concerned about falling and orthostasis right now. Caregivers and family aware of risks. Continue current regimen.

## 2015-01-23 NOTE — Progress Notes (Signed)
BP 122/58 mmHg  Pulse 62  Temp(Src) 97.8 F (36.6 C)  Wt 99 lb 9.6 oz (45.178 kg)   Subjective:    Patient ID: Mary [name], female    DOB: 07-05-1921, 81 y.o.   MRN: [id number]  HPI: Mary [name] is a 81 y.o. female  Chief Complaint  Patient presents with  . hospital fuv    Patient fell, she was seen at the ER twice.   ER FOLLOW UP Time since discharge: 12 days since last visit, 1 month since 1st visit Hospital/facility: [facility] Diagnosis: Fall with head trauma- fell in the shower and hit her head. No LOC/ AMS, UTI, Head injury from running into a glass door Procedures/tests: CT head with scalp hematoma, otherwise normal/ CT head- no acute findings, UA showed 2+ leuks, started on keflex Consultants: None New medications: None Discharge instructions: follow up here  Status: better  Saw Dr. Malvin Johns in October, doing well. To see him again in April.   URINARY SYMPTOMS- has been having a problem with peeing more frequently recently Dysuria: no Urinary frequency: yes Urgency: yes Small volume voids: yes Symptom severity: mild Urinary incontinence: no Foul odor: no Hematuria: no Abdominal pain: no Back pain: no Suprapubic pain/pressure: no Flank pain: no Fever:  no Vomiting: no Better after the keflex, but never really went away  Relevant past medical, surgical, family and social history reviewed and updated as indicated. Interim medical history since our last visit reviewed. Allergies and medications reviewed and updated.  Review of Systems  Constitutional: Negative.   Respiratory: Negative.   Cardiovascular: Negative.   Neurological: Negative.   Psychiatric/Behavioral: Negative.     Per HPI unless specifically indicated above     Objective:    BP 122/58 mmHg  Pulse 62  Temp(Src) 97.8 F (36.6 C)  Wt 99 lb 9.6 oz (45.178 kg)  Wt Readings from Last 3 Encounters:  01/23/15 99 lb 9.6 oz (45.178 kg)  01/11/15 110 lb (49.896 kg)  12/24/14 105 lb (47.628 kg)     Physical Exam  Constitutional: She is oriented to person, place, and time. She appears well-developed. She appears cachectic. No distress.  HENT:  Head: Normocephalic and atraumatic.  Right Ear: Hearing normal.  Left Ear: Hearing normal.  Nose: Nose normal.  Eyes: Conjunctivae and lids are normal. Right eye exhibits no discharge. Left eye exhibits no discharge. No scleral icterus.  Cardiovascular: Normal rate, regular rhythm, normal heart sounds and intact distal pulses.  Exam reveals no gallop and no friction rub.   No murmur heard. Pulmonary/Chest: Effort normal and breath sounds normal. No respiratory distress. She has no wheezes. She has no rales. She exhibits no tenderness.  Musculoskeletal: Normal range of motion.  Neurological: She is alert and oriented to person, place, and time.  Skin: Skin is warm, dry and intact. No rash noted. She is not diaphoretic. No erythema. No pallor.  Psychiatric: She has a normal mood and affect. Her speech is normal and behavior is normal. Judgment and thought content normal. Cognition and memory are normal.  Nursing note and vitals reviewed.   Results for orders placed or performed during the hospital encounter of 12/24/14  Urine culture  Result Value Ref Range   Specimen Description URINE, RANDOM    Special Requests NONE    Culture MULTIPLE SPECIES PRESENT, SUGGEST RECOLLECTION    Report Status 12/26/2014 FINAL   Comprehensive metabolic panel  Result Value Ref Range   Sodium 137 135 - 145 mmol/L   Potassium 4.2  3.5 - 5.1 mmol/L   Chloride 98 (L) 101 - 111 mmol/L   CO2 32 22 - 32 mmol/L   Glucose, Bld 106 (H) 65 - 99 mg/dL   BUN 13 6 - 20 mg/dL   Creatinine, Ser 1.610.70 0.44 - 1.00 mg/dL   Calcium 9.5 8.9 - 09.610.3 mg/dL   Total Protein 7.3 6.5 - 8.1 g/dL   Albumin 3.0 (L) 3.5 - 5.0 g/dL   AST 52 (H) 15 - 41 U/L   ALT 47 14 - 54 U/L   Alkaline Phosphatase 98 38 - 126 U/L   Total Bilirubin 0.3 0.3 - 1.2 mg/dL   GFR calc non Af Amer >60 >60  mL/min   GFR calc Af Amer >60 >60 mL/min   Anion gap 7 5 - 15  CBC  Result Value Ref Range   WBC 5.3 3.6 - 11.0 K/uL   RBC 4.57 3.80 - 5.20 MIL/uL   Hemoglobin 12.5 12.0 - 16.0 g/dL   HCT 04.539.3 40.935.0 - 81.147.0 %   MCV 86.1 80.0 - 100.0 fL   MCH 27.3 26.0 - 34.0 pg   MCHC 31.7 (L) 32.0 - 36.0 g/dL   RDW 91.414.9 (H) 78.211.5 - 95.614.5 %   Platelets 216 150 - 440 K/uL  Urinalysis complete, with microscopic ([facility] only)  Result Value Ref Range   Color, Urine YELLOW (A) YELLOW   APPearance CLEAR (A) CLEAR   Glucose, UA NEGATIVE NEGATIVE mg/dL   Bilirubin Urine NEGATIVE NEGATIVE   Ketones, ur NEGATIVE NEGATIVE mg/dL   Specific Gravity, Urine 1.015 1.005 - 1.030   Hgb urine dipstick NEGATIVE NEGATIVE   pH 6.0 5.0 - 8.0   Protein, ur 30 (A) NEGATIVE mg/dL   Nitrite NEGATIVE NEGATIVE   Leukocytes, UA 2+ (A) NEGATIVE   RBC / HPF 6-30 0 - 5 RBC/hpf   WBC, UA 6-30 0 - 5 WBC/hpf   Bacteria, UA RARE (A) NONE SEEN   Squamous Epithelial / LPF 0-5 (A) NONE SEEN   Mucous PRESENT       Assessment & Plan:   Problem List Items Addressed This Visit      Genitourinary   Benign hypertensive renal disease    BP very labile. Better on recheck. More concerned about falling and orthostasis right now. Caregivers and family aware of risks. Continue current regimen.          Other   Malnutrition (HCC)    Will increase ensure to TID. Will check back in in 1 month for weight check. Possibly due to coming off her remeron. If continuing to have her weight go down, will discuss restarting remeron with neurology.       Other Visit Diagnoses    Acute cystitis without hematuria    -  Primary    Will start nitrofurantoin. Call if not getting better or getting worse. Await cultute results.     Relevant Orders    UA/M w/rflx Culture, Routine        Follow up plan: Return in about 4 weeks (around 02/20/2015) for Weight check.

## 2015-01-25 LAB — UA/M W/RFLX CULTURE, ROUTINE

## 2015-01-29 ENCOUNTER — Ambulatory Visit: Payer: Medicare Other | Admitting: Family Medicine

## 2015-02-20 ENCOUNTER — Ambulatory Visit (INDEPENDENT_AMBULATORY_CARE_PROVIDER_SITE_OTHER): Payer: Medicare Other | Admitting: Family Medicine

## 2015-02-20 ENCOUNTER — Encounter: Payer: Self-pay | Admitting: Family Medicine

## 2015-02-20 VITALS — BP 158/64 | HR 59 | Temp 98.1°F | Ht 60.6 in | Wt 99.4 lb

## 2015-02-20 DIAGNOSIS — E46 Unspecified protein-calorie malnutrition: Secondary | ICD-10-CM | POA: Diagnosis not present

## 2015-02-20 NOTE — Assessment & Plan Note (Signed)
Maintaining weight. Will continue ensure. Recheck in 3 months, if weight dropping again, will restart remeron.

## 2015-02-20 NOTE — Progress Notes (Signed)
BP 158/64 mmHg  Pulse 59  Temp(Src) 98.1 F (36.7 C)  Ht 5' 0.6" (1.539 m)  Wt 99 lb 6 oz (45.076 kg)  BMI 19.03 kg/m2  SpO2 99%   Subjective:    Patient ID: Mary [name], female    DOB: 12/15/1921, 82 y.o.   MRN: [id number]  HPI: Mary [name] is a 82 y.o. female  Chief Complaint  Patient presents with  . Weight Check   Here today for weight check after coming off her remeron from the neurologist. Weight has been stable. Has been eating well and has been doing well.  Relevant past medical, surgical, family and social history reviewed and updated as indicated. Interim medical history since our last visit reviewed. Allergies and medications reviewed and updated.  Review of Systems  Constitutional: Negative.   Respiratory: Negative.   Cardiovascular: Negative.   Genitourinary: Negative.   Psychiatric/Behavioral: Negative.     Per HPI unless specifically indicated above     Objective:    BP 158/64 mmHg  Pulse 59  Temp(Src) 98.1 F (36.7 C)  Ht 5' 0.6" (1.539 m)  Wt 99 lb 6 oz (45.076 kg)  BMI 19.03 kg/m2  SpO2 99%  Wt Readings from Last 3 Encounters:  02/20/15 99 lb 6 oz (45.076 kg)  01/23/15 99 lb 9.6 oz (45.178 kg)  01/11/15 110 lb (49.896 kg)    Physical Exam  Constitutional: She is oriented to person, place, and time. She appears well-developed and well-nourished. No distress.  HENT:  Head: Normocephalic and atraumatic.  Right Ear: Hearing and external ear normal.  Left Ear: Hearing and external ear normal.  Nose: Nose normal.  Mouth/Throat: Oropharynx is clear and moist. No oropharyngeal exudate.  Eyes: Conjunctivae, EOM and lids are normal. Pupils are equal, round, and reactive to light. Right eye exhibits no discharge. Left eye exhibits no discharge. No scleral icterus.  Cardiovascular: Normal rate, regular rhythm and intact distal pulses.  Exam reveals no gallop and no friction rub.   Murmur heard. Pulmonary/Chest: Effort normal and breath sounds normal.  No respiratory distress. She has no wheezes. She has no rales. She exhibits no tenderness.  Musculoskeletal: Normal range of motion.  Neurological: She is alert and oriented to person, place, and time.  Skin: Skin is warm and intact. No rash noted. No erythema. No pallor.  Psychiatric: She has a normal mood and affect. Her speech is normal and behavior is normal. Judgment and thought content normal. Cognition and memory are normal.  Nursing note and vitals reviewed.   Results for orders placed or performed in visit on 01/23/15  Microscopic Examination  Result Value Ref Range   WBC, UA 6-10 (A) 0 -  5 /hpf   RBC, UA 3-10 (A) 0 -  2 /hpf   Epithelial Cells (non renal) 0-10 0 - 10 /hpf   Mucus, UA Present Not Estab.   Bacteria, UA Few None seen/Few  UA/M w/rflx Culture, Routine  Result Value Ref Range   Urine Culture, Routine Final report    Urine Culture result 1 Comment       Assessment & Plan:   Problem List Items Addressed This Visit      Other   Malnutrition (HCC) - Primary    Maintaining weight. Will continue ensure. Recheck in 3 months, if weight dropping again, will restart remeron.           Follow up plan: Return in about 3 months (around 05/21/2015).

## 2015-03-12 ENCOUNTER — Other Ambulatory Visit: Payer: Self-pay | Admitting: Family Medicine

## 2015-03-13 ENCOUNTER — Other Ambulatory Visit: Payer: Self-pay | Admitting: Family Medicine

## 2015-04-02 ENCOUNTER — Telehealth: Payer: Self-pay | Admitting: Family Medicine

## 2015-04-02 DIAGNOSIS — R300 Dysuria: Secondary | ICD-10-CM

## 2015-04-02 NOTE — Telephone Encounter (Signed)
OK to give urine, if we have an appointment that'd be good, otherwise just get her to bring one in.

## 2015-04-02 NOTE — Telephone Encounter (Addendum)
Pt is showing signs of a uti and [facility] would like to have an order for a urine.

## 2015-04-03 ENCOUNTER — Other Ambulatory Visit: Payer: Self-pay | Admitting: Family Medicine

## 2015-04-03 ENCOUNTER — Ambulatory Visit (INDEPENDENT_AMBULATORY_CARE_PROVIDER_SITE_OTHER): Payer: Medicare Other | Admitting: Family Medicine

## 2015-04-03 ENCOUNTER — Encounter: Payer: Self-pay | Admitting: Family Medicine

## 2015-04-03 VITALS — BP 138/52 | HR 58 | Temp 98.0°F | Wt 103.0 lb

## 2015-04-03 DIAGNOSIS — R410 Disorientation, unspecified: Secondary | ICD-10-CM

## 2015-04-03 DIAGNOSIS — R9431 Abnormal electrocardiogram [ECG] [EKG]: Secondary | ICD-10-CM | POA: Diagnosis not present

## 2015-04-03 DIAGNOSIS — R300 Dysuria: Secondary | ICD-10-CM

## 2015-04-03 DIAGNOSIS — I129 Hypertensive chronic kidney disease with stage 1 through stage 4 chronic kidney disease, or unspecified chronic kidney disease: Secondary | ICD-10-CM

## 2015-04-03 DIAGNOSIS — R001 Bradycardia, unspecified: Secondary | ICD-10-CM

## 2015-04-03 DIAGNOSIS — N3001 Acute cystitis with hematuria: Secondary | ICD-10-CM

## 2015-04-03 MED ORDER — NITROFURANTOIN MONOHYD MACRO 100 MG PO CAPS: 100.0000 mg | ORAL_CAPSULE | Freq: Two times a day (BID) | ORAL | Status: DC

## 2015-04-03 NOTE — Assessment & Plan Note (Signed)
BP very labile. Better on recheck. More concerned about falling and orthostasis right now. Caregivers and family aware of risks. Continue current regimen.   

## 2015-04-03 NOTE — Progress Notes (Signed)
BP 138/52 mmHg  Pulse 58  Temp(Src) 98 F (36.7 C)  Ht   Wt 103 lb (46.72 kg)   Subjective:    Patient ID: Mary Gentilefemale    DOB: 1921/10/18, 82 y.o.   MRN: [id number]  HPI: Mary [name] is a 82 y.o. female  Chief Complaint  Patient presents with  . Altered Mental Status    ? UTI   Mary [name] has been confused again. Has been having trouble sleeping. Has not been acting like herself. Has been having accidents. Has been wandering again. These are all symptoms that she has when she has a UTI. She is otherwise feeling well with no complaints. No falls. She states that she is feeling her normal self. Most of the history given by care provider due to patient's dementia. They deny that she has been having any issues with SOB, Chest pain or any other symptoms besides what is mentioned above.    Relevant past medical, surgical, family and social history reviewed and updated as indicated. Interim medical history since our last visit reviewed. Allergies and medications reviewed and updated.  Review of Systems  Constitutional: Negative.   HENT: Negative.   Respiratory: Negative.   Cardiovascular: Negative.   Gastrointestinal: Negative.   Genitourinary: Positive for frequency and enuresis. Negative for dysuria, urgency, hematuria, flank pain, decreased urine volume, vaginal bleeding, vaginal discharge, difficulty urinating, genital sores, vaginal pain, menstrual problem, pelvic pain and dyspareunia.  Psychiatric/Behavioral: Positive for behavioral problems, confusion, sleep disturbance and agitation. Negative for suicidal ideas, hallucinations, self-injury, dysphoric mood and decreased concentration. The patient is not nervous/anxious and is not hyperactive.     Per HPI unless specifically indicated above     Objective:    BP 138/52 mmHg  Pulse 58  Temp(Src) 98 F (36.7 C)  Ht   Wt 103 lb (46.72 kg)  Wt Readings from Last 3 Encounters:  04/03/15 103 lb (46.72 kg)  02/20/15 99 lb 6  oz (45.076 kg)  01/23/15 99 lb 9.6 oz (45.178 kg)    Physical Exam  Constitutional: She is oriented to person, place, and time. She appears well-developed and well-nourished. No distress.  HENT:  Head: Normocephalic and atraumatic.  Right Ear: Hearing normal.  Left Ear: Hearing normal.  Nose: Nose normal.  Eyes: Conjunctivae and lids are normal. Right eye exhibits no discharge. Left eye exhibits no discharge. No scleral icterus.  Cardiovascular: Normal rate, regular rhythm and intact distal pulses.   Murmur heard. Pulmonary/Chest: Effort normal and breath sounds normal. No respiratory distress. She has no wheezes. She has no rales. She exhibits no tenderness.  Abdominal: Soft. Bowel sounds are normal. She exhibits no distension and no mass. There is no tenderness. There is no rebound and no guarding.  Musculoskeletal: Normal range of motion.  Neurological: She is alert and oriented to person, place, and time.  Skin: Skin is warm, dry and intact. No rash noted. She is not diaphoretic. No erythema. No pallor.  Psychiatric: She has a normal mood and affect. Her speech is normal and behavior is normal. Judgment and thought content normal. Cognition and memory are normal.    Results for orders placed or performed in visit on 04/03/15  Microscopic Examination  Result Value Ref Range   WBC, UA 6-10 (A) 0 -  5 /hpf   RBC, UA 3-10 (A) 0 -  2 /hpf   Epithelial Cells (non renal) 0-10 0 - 10 /hpf   Bacteria, UA Few None seen/Few  UA/M w/rflx Culture,  Routine  Result Value Ref Range   Specific Gravity, UA 1.020 1.005 - 1.030   pH, UA 6.0 5.0 - 7.5   Color, UA Yellow Yellow   Appearance Ur Clear Clear   Leukocytes, UA 2+ (A) Negative   Protein, UA 1+ (A) Negative/Trace   Glucose, UA Negative Negative   Ketones, UA Negative Negative   RBC, UA 1+ (A) Negative   Bilirubin, UA Negative Negative   Urobilinogen, Ur 0.2 0.2 - 1.0 mg/dL   Nitrite, UA Negative Negative   Microscopic Examination  See below:    Urinalysis Reflex Comment   Urine Culture, Routine  Result Value Ref Range   Urine Culture, Routine WILL FOLLOW       Assessment & Plan:   Problem List Items Addressed This Visit      Genitourinary   Benign hypertensive renal disease    BP very labile. Better on recheck. More concerned about falling and orthostasis right now. Caregivers and family aware of risks. Continue current regimen.        Other Visit Diagnoses    Acute cystitis with hematuria    -  Primary    Will treat with nitrofurantoin. Await culture. Recheck urine in 2 weeks.     Mental confusion        Likely due to UTI, as this has happened before. Treat UTI. Recheck 2 weeks to see how she's doing.     Dysuria        Will check urine. +UTI    Bradycardia        Better on recheck, but still concerned at 76- referral to cardiology made today. Not on beta blocker.     Relevant Orders    EKG 12-Lead (Completed)    Ambulatory referral to Cardiology    Abnormal EKG        Referral to cardiology made today. To see them on 2/20. Continue to monitor. Warning signs for which to go to the ER discussed.    Relevant Orders    Ambulatory referral to Cardiology        Follow up plan: Return in about 2 weeks (around 04/17/2015) for recheck urine.

## 2015-04-05 LAB — UA/M W/RFLX CULTURE, ROUTINE
Bilirubin, UA: NEGATIVE
Glucose, UA: NEGATIVE
KETONES UA: NEGATIVE
Nitrite, UA: NEGATIVE
SPEC GRAV UA: 1.02 (ref 1.005–1.030)
Urobilinogen, Ur: 0.2 mg/dL (ref 0.2–1.0)
pH, UA: 6 (ref 5.0–7.5)

## 2015-04-05 LAB — MICROSCOPIC EXAMINATION

## 2015-04-05 LAB — URINE CULTURE, REFLEX

## 2015-04-09 ENCOUNTER — Ambulatory Visit (INDEPENDENT_AMBULATORY_CARE_PROVIDER_SITE_OTHER): Payer: Medicare Other | Admitting: Cardiovascular Disease

## 2015-04-09 ENCOUNTER — Encounter: Payer: Self-pay | Admitting: Cardiovascular Disease

## 2015-04-09 VITALS — BP 162/72 | HR 59 | Ht 64.0 in | Wt 98.5 lb

## 2015-04-09 DIAGNOSIS — R9431 Abnormal electrocardiogram [ECG] [EKG]: Secondary | ICD-10-CM | POA: Insufficient documentation

## 2015-04-09 DIAGNOSIS — I129 Hypertensive chronic kidney disease with stage 1 through stage 4 chronic kidney disease, or unspecified chronic kidney disease: Secondary | ICD-10-CM

## 2015-04-09 DIAGNOSIS — R569 Unspecified convulsions: Secondary | ICD-10-CM | POA: Diagnosis not present

## 2015-04-09 DIAGNOSIS — F0391 Unspecified dementia with behavioral disturbance: Secondary | ICD-10-CM | POA: Diagnosis not present

## 2015-04-09 NOTE — Assessment & Plan Note (Signed)
Nonspecific T-wave abnormality noted. She does not have any symptoms concerning for ischemia. I did discuss various treatment options including echocardiogram, stress testing. She did not seem particularly interested, caretakers reported that she was asymptomatic. We'll hold off for now as she is doing well but recommended that she call us if she has any shortness of breath or new symptoms.  Heart rate seems to be well controlled in the high 50s, 60 range. Heart rate is monitored with their periodic vitals If heart rate runs consistently in the 40s, affecting her blood pressure, this may be an indication for pacemaker. Currently seems to be stable  There is also significant APCs causing irregular heart rate. This is not atrial fibrillation, rather normal sinus rhythm with ectopy.

## 2015-04-09 NOTE — Assessment & Plan Note (Signed)
Caretakers report that she has not had any further seizures on her current medications

## 2015-04-09 NOTE — Assessment & Plan Note (Signed)
Per the caretakers, blood pressure seems to range between 120 and 160, improving through the day They're currently monitoring vitals. Not urgent to make any changes at this time, If blood pressure does stay consistently elevated, did increase losartan up to 50 mgrams twice a day

## 2015-04-09 NOTE — Assessment & Plan Note (Signed)
Pleasantly demented on today's visit, did not remember that she had a roommate, did not remember that she had a walker, repetition of questions. Overall denied any symptoms concerning for cardiac disease

## 2015-04-09 NOTE — Patient Instructions (Signed)
You are doing well. No medication changes were made.  Please call us if you have new issues that need to be addressed before your next appt.    

## 2015-04-09 NOTE — Progress Notes (Signed)
Patient ID: Mary [name], female    DOB: [birth year], 82 y.o.   MRN: [id number]  HPI Comments: Mary [name] is a pleasant 82 year old woman with history of hypertension, seizures, dementia who presents by referral from Mary [name] for evaluation of bradycardia, abnormal EKG. She currently lives in a nursing home called [facility], 5 other residents. She has 1 roomma  She presents today and reports that she is doing well She walks without a walker. Caretakers with her today reports that she refuses to use her walker She has had previous falls, none recently  Previous notes reviewed, indicating history of urinary tract infection, heart rate at that time below 50, blood pressure was stable EKGs reviewed typically showing heart rate in the high 50s She reports that she is asymptomatic, denies any lightheadedness or dizziness  Caretakers report blood pressure is elevated in the morning, improves during the daytime She does have vitals on her paperwork today showing systolic pressure ranging between 120s up to 150 Their report it will go higher with her activities  She denies any chest pain or shortness of breath on exertion, no lower extremity edema  EKG from today's visit shows normal sinus rhythm, rate 59 bpm, APCs, nonspecific T-wave abnormality anterolateral leads in V4 through V6, seen previously  Review of previous EKGs suggests atrial fibrillation, this was actually normal sinus rhythm with ectopy   No Known Allergies  Current Outpatient Prescriptions on File Prior to Visit  Medication Sig Dispense Refill  . acetaminophen (TYLENOL) 325 MG tablet Take 650 mg by mouth every 6 (six) hours as needed for mild pain or headache.     Marland Kitchen alum & mag hydroxide-simeth (MAALOX/MYLANTA) 200-200-20 MG/5ML suspension Take 30 mLs by mouth as needed for indigestion or heartburn.     Marland Kitchen aspirin EC 81 MG tablet Take 81 mg by mouth daily.    Marland Kitchen bismuth subsalicylate (PEPTO BISMOL) 262 MG/15ML suspension  Take 30 mLs by mouth every 6 (six) hours as needed (for GI upset).    . Cholecalciferol (VITAMIN D3) 5000 units CAPS Take 5,000 Units by mouth.    . Cranberry 405 MG CAPS Take 1 capsule by mouth 2 (two) times daily.    . divalproex (DEPAKOTE) 125 MG DR tablet Take 125 mg by mouth 2 (two) times daily.    Marland Kitchen donepezil (ARICEPT) 10 MG tablet TAKE 1 TABLET BY MOUTH ONCE DAILY FOR MEMORY 30 tablet 6  . GNP VITAMIN D SUPER STRENGTH 5000 units TABS TAKE 1 TABLET BY MOUTH ONCE DAILY FOR SUPPLEMENT 30 each 12  . loperamide (IMODIUM A-D) 2 MG tablet Take 2 mg by mouth 4 (four) times daily as needed for diarrhea or loose stools.    Marland Kitchen losartan (COZAAR) 50 MG tablet TAKE 1 TABLET BY MOUTH ONCE DAILY FOR BP 90 tablet 1  . magnesium hydroxide (MILK OF MAGNESIA) 400 MG/5ML suspension Take 30 mLs by mouth daily as needed for mild constipation.     . Multiple Vitamin (MULTIVITAMIN) tablet Take 1 tablet by mouth daily.    . nitrofurantoin, macrocrystal-monohydrate, (MACROBID) 100 MG capsule Take 1 capsule (100 mg total) by mouth 2 (two) times daily. 20 capsule 0  . traZODone (DESYREL) 50 MG tablet TAKE 1 TABLET BY MOUTH ONCE DAILY AT BEDTIME FOR SLEEP 30 tablet 6   No current facility-administered medications on file prior to visit.    Past Medical History  Diagnosis Date  . Arthritis   . Hypertension   . Kidney disease   .  Frequent falls   . Alzheimer disease   . Seizure [facility])     Past Surgical History  Procedure Laterality Date  . Tubal ligation      Social History  reports that she has never smoked. She has never used smokeless tobacco. She reports that she does not drink alcohol or use illicit drugs.  Family History family history includes Diabetes in her sister; Heart attack in her father; Heart disease in her mother; Hypertension in her father and mother; Stroke in her mother and sister.    Review of Systems  Constitutional: Negative.   HENT: Negative.   Eyes: Negative.   Respiratory:  Negative.   Cardiovascular: Negative.   Gastrointestinal: Negative.   Endocrine: Negative.   Musculoskeletal: Negative.   Skin: Negative.   Allergic/Immunologic: Negative.   Neurological: Negative.   Hematological: Negative.   Psychiatric/Behavioral: Negative.   All other systems reviewed and are negative.   BP 162/72 mmHg  Pulse 59  Ht  (1.626 m)  Wt 98 lb 8 oz (44.679 kg)  BMI 16.90 kg/m2   Physical Exam  Constitutional: She is oriented to person, place, and time.  Thin, presenting a wheelchair  HENT:  Head: Normocephalic.  Nose: Nose normal.  Mouth/Throat: Oropharynx is clear and moist.  Eyes: Conjunctivae are normal. Pupils are equal, round, and reactive to light.  Neck: Normal range of motion. Neck supple. No JVD present.  Cardiovascular: Normal rate, regular rhythm, normal heart sounds and intact distal pulses.  Exam reveals no gallop and no friction rub.   No murmur heard. Pulmonary/Chest: Effort normal and breath sounds normal. No respiratory distress. She has no wheezes. She has no rales. She exhibits no tenderness.  Abdominal: Soft. Bowel sounds are normal. She exhibits no distension. There is no tenderness.  Musculoskeletal: Normal range of motion. She exhibits no edema or tenderness.  Lymphadenopathy:    She has no cervical adenopathy.  Neurological: She is alert and oriented to person, place, and time. Coordination normal.  Skin: Skin is warm and dry. No rash noted. No erythema.  Psychiatric: She has a normal mood and affect. Her behavior is normal. Judgment and thought content normal.

## 2015-04-11 ENCOUNTER — Other Ambulatory Visit: Payer: Self-pay | Admitting: Family Medicine

## 2015-04-17 ENCOUNTER — Encounter: Payer: Self-pay | Admitting: Family Medicine

## 2015-04-17 ENCOUNTER — Ambulatory Visit (INDEPENDENT_AMBULATORY_CARE_PROVIDER_SITE_OTHER): Payer: Medicare Other | Admitting: Family Medicine

## 2015-04-17 VITALS — BP 122/58 | HR 89 | Temp 98.0°F | Ht 64.0 in | Wt 101.0 lb

## 2015-04-17 DIAGNOSIS — N390 Urinary tract infection, site not specified: Secondary | ICD-10-CM | POA: Diagnosis not present

## 2015-04-17 NOTE — Progress Notes (Signed)
BP 122/58 mmHg  Pulse 89  Temp(Src) 98 F (36.7 C)  Ht  (1.626 m)  Wt 101 lb (45.813 kg)  BMI 17.33 kg/m2   Subjective:    Patient ID: Mary [name], female    DOB: 1922/01/02, 82 y.o.   MRN: [id number]  HPI: Mary [name] is a 82 y.o. female  Chief Complaint  Patient presents with  . Urinary Tract Infection    Patient is here for a two week follow up of UTI   Patient is doing much better. Has not been confused. Has not been wandering at night. No accidents. No fevers or chills. Took all her antibiotics and is all done now. Has no other concerns at this time and is otherwise doing well.    Relevant past medical, surgical, family and social history reviewed and updated as indicated. Interim medical history since our last visit reviewed. Allergies and medications reviewed and updated.  Review of Systems  Constitutional: Negative.   Respiratory: Negative.   Cardiovascular: Negative.   Gastrointestinal: Negative.   Genitourinary: Negative.   Psychiatric/Behavioral: Negative.     Per HPI unless specifically indicated above     Objective:    BP 122/58 mmHg  Pulse 89  Temp(Src) 98 F (36.7 C)  Ht  (1.626 m)  Wt 101 lb (45.813 kg)  BMI 17.33 kg/m2  Wt Readings from Last 3 Encounters:  04/17/15 101 lb (45.813 kg)  04/09/15 98 lb 8 oz (44.679 kg)  04/03/15 103 lb (46.72 kg)    Physical Exam  Constitutional: She is oriented to person, place, and time. She appears well-developed and well-nourished. No distress.  HENT:  Head: Normocephalic and atraumatic.  Right Ear: Hearing normal.  Left Ear: Hearing normal.  Nose: Nose normal.  Eyes: Conjunctivae and lids are normal. Right eye exhibits no discharge. Left eye exhibits no discharge. No scleral icterus.  Cardiovascular: Normal rate, regular rhythm, normal heart sounds and intact distal pulses.  Exam reveals no gallop and no friction rub.   No murmur heard. Pulmonary/Chest: Effort normal and breath sounds normal. No  respiratory distress. She has no wheezes. She has no rales. She exhibits no tenderness.  Abdominal: Soft. Bowel sounds are normal. She exhibits no distension and no mass. There is no tenderness. There is no rebound and no guarding.  Musculoskeletal: Normal range of motion.  Neurological: She is alert and oriented to person, place, and time.  Skin: Skin is warm, dry and intact. No rash noted. No erythema. No pallor.  Psychiatric: She has a normal mood and affect. Her speech is normal and behavior is normal. Judgment and thought content normal. Cognition and memory are normal.  Nursing note and vitals reviewed.   Results for orders placed or performed in visit on 04/17/15  Microscopic Examination  Result Value Ref Range   WBC, UA 6-10 (A) 0 -  5 /hpf   RBC, UA 11-30 (A) 0 -  2 /hpf   Epithelial Cells (non renal) 0-10 0 - 10 /hpf   Mucus, UA Present Not Estab.   Bacteria, UA Few None seen/Few  UA/M w/rflx Culture, Routine  Result Value Ref Range   Specific Gravity, UA 1.025 1.005 - 1.030   pH, UA 5.5 5.0 - 7.5   Color, UA Yellow Yellow   Appearance Ur Clear Clear   Leukocytes, UA 1+ (A) Negative   Protein, UA 1+ (A) Negative/Trace   Glucose, UA Negative Negative   Ketones, UA Negative Negative   RBC, UA 3+ (  A) Negative   Bilirubin, UA Negative Negative   Urobilinogen, Ur 0.2 0.2 - 1.0 mg/dL   Nitrite, UA Negative Negative   Microscopic Examination See below:    Urinalysis Reflex Comment   Urine Culture, Routine  Result Value Ref Range   Urine Culture, Routine WILL FOLLOW       Assessment & Plan:   Problem List Items Addressed This Visit    None    Visit Diagnoses    Urinary tract infection without hematuria, site unspecified    -  Primary    UA dirty again today. Symptoms resolved at this time. Will wait on culture to determine if she needs to be treated. Call if confusion starts and we will treat.     Relevant Orders    UA/M w/rflx Culture, Routine (Completed)         Follow up plan: Return April for follow up.

## 2015-04-25 ENCOUNTER — Telehealth: Payer: Self-pay | Admitting: Family Medicine

## 2015-04-25 NOTE — Telephone Encounter (Signed)
Care providers notified 

## 2015-04-25 NOTE — Telephone Encounter (Signed)
Please let the home know that her urine didn't grow out any bacteria. Thanks!

## 2015-04-26 LAB — UA/M W/RFLX CULTURE, ROUTINE
Bilirubin, UA: NEGATIVE
Glucose, UA: NEGATIVE
Ketones, UA: NEGATIVE
Nitrite, UA: NEGATIVE
PH UA: 5.5 (ref 5.0–7.5)
Specific Gravity, UA: 1.025 (ref 1.005–1.030)
UUROB: 0.2 mg/dL (ref 0.2–1.0)

## 2015-04-26 LAB — MICROSCOPIC EXAMINATION

## 2015-04-26 LAB — URINE CULTURE, REFLEX

## 2015-05-14 ENCOUNTER — Other Ambulatory Visit: Payer: Self-pay | Admitting: Family Medicine

## 2015-05-21 ENCOUNTER — Ambulatory Visit (INDEPENDENT_AMBULATORY_CARE_PROVIDER_SITE_OTHER): Payer: Medicare Other | Admitting: Family Medicine

## 2015-05-21 ENCOUNTER — Encounter: Payer: Self-pay | Admitting: Family Medicine

## 2015-05-21 VITALS — BP 156/56 | HR 60 | Temp 98.0°F | Wt 103.0 lb

## 2015-05-21 DIAGNOSIS — E46 Unspecified protein-calorie malnutrition: Secondary | ICD-10-CM

## 2015-05-21 DIAGNOSIS — Z13220 Encounter for screening for lipoid disorders: Secondary | ICD-10-CM | POA: Diagnosis not present

## 2015-05-21 DIAGNOSIS — I129 Hypertensive chronic kidney disease with stage 1 through stage 4 chronic kidney disease, or unspecified chronic kidney disease: Secondary | ICD-10-CM | POA: Diagnosis not present

## 2015-05-21 DIAGNOSIS — F0391 Unspecified dementia with behavioral disturbance: Secondary | ICD-10-CM | POA: Diagnosis not present

## 2015-05-21 NOTE — Assessment & Plan Note (Signed)
BP very labile. Better on recheck. More concerned about falling and orthostasis right now. Caregivers and family aware of risks. Continue current regimen.   

## 2015-05-21 NOTE — Assessment & Plan Note (Signed)
Stable. Continue current regimen. Checking labs today. Recheck 3 months.

## 2015-05-21 NOTE — Assessment & Plan Note (Signed)
Weight up by 2 pounds! Will stay off the remeron. Continue to monitor. Continue ensure. Recheck 3 months.

## 2015-05-21 NOTE — Progress Notes (Signed)
BP 156/56 mmHg  Pulse 60  Temp(Src) 98 F (36.7 C)  Wt 103 lb (46.72 kg)  SpO2 98%   Subjective:    Patient ID: Mary [name], female    DOB: 1921-05-13, 82 y.o.   MRN: [id number]  HPI: Mary [name] is a 82 y.o. female  Chief Complaint  Patient presents with  . Dementia   Doing very well today! No increased confusion. No increased wandering. No concern about UTI today. Happy and doing well. Continuing to take her aricept. Continuing to follow with neurology. No seizure activity.   HYPERTENSION Hypertension status: stable  Satisfied with current treatment? yes Duration of hypertension: chronic BP monitoring frequency:  a few times a week BP range: 120s/60s BP medication side effects:  no Medication compliance: excellent compliance Aspirin: yes Recurrent headaches: no Visual changes: no Palpitations: no Dyspnea: no Chest pain: no Lower extremity edema: no Dizzy/lightheaded: no  Has been eating well. No concerns.   Relevant past medical, surgical, family and social history reviewed and updated as indicated. Interim medical history since our last visit reviewed. Allergies and medications reviewed and updated.  Review of Systems  Constitutional: Negative.   Respiratory: Negative.   Cardiovascular: Negative.   Psychiatric/Behavioral: Negative.     Per HPI unless specifically indicated above     Objective:    BP 156/56 mmHg  Pulse 60  Temp(Src) 98 F (36.7 C)  Wt 103 lb (46.72 kg)  SpO2 98%  Wt Readings from Last 3 Encounters:  05/21/15 103 lb (46.72 kg)  04/17/15 101 lb (45.813 kg)  04/09/15 98 lb 8 oz (44.679 kg)    Physical Exam  Constitutional: She is oriented to person, place, and time. She appears well-developed. She appears cachectic. No distress.  HENT:  Head: Normocephalic and atraumatic.  Right Ear: Hearing normal.  Left Ear: Hearing normal.  Nose: Nose normal.  Eyes: Conjunctivae and lids are normal. Right eye exhibits no discharge. Left eye  exhibits no discharge. No scleral icterus.  Cardiovascular: Normal rate, regular rhythm and intact distal pulses.  Exam reveals no gallop and no friction rub.   Murmur heard. Pulmonary/Chest: Effort normal and breath sounds normal. No respiratory distress. She has no wheezes. She has no rales. She exhibits no tenderness.  Musculoskeletal: Normal range of motion.  Neurological: She is alert and oriented to person, place, and time.  Skin: Skin is warm, dry and intact. No rash noted. She is not diaphoretic. No erythema. No pallor.  Psychiatric: She has a normal mood and affect. Her speech is normal and behavior is normal. Judgment and thought content normal. Cognition and memory are impaired.  Nursing note and vitals reviewed.   Results for orders placed or performed in visit on 04/17/15  Microscopic Examination  Result Value Ref Range   WBC, UA 6-10 (A) 0 -  5 /hpf   RBC, UA 11-30 (A) 0 -  2 /hpf   Epithelial Cells (non renal) 0-10 0 - 10 /hpf   Mucus, UA Present Not Estab.   Bacteria, UA Few None seen/Few  UA/M w/rflx Culture, Routine  Result Value Ref Range   Specific Gravity, UA 1.025 1.005 - 1.030   pH, UA 5.5 5.0 - 7.5   Color, UA Yellow Yellow   Appearance Ur Clear Clear   Leukocytes, UA 1+ (A) Negative   Protein, UA 1+ (A) Negative/Trace   Glucose, UA Negative Negative   Ketones, UA Negative Negative   RBC, UA 3+ (A) Negative   Bilirubin, UA Negative  Negative   Urobilinogen, Ur 0.2 0.2 - 1.0 mg/dL   Nitrite, UA Negative Negative   Microscopic Examination See below:    Urinalysis Reflex Comment   Urine Culture, Routine  Result Value Ref Range   Urine Culture, Routine Final report    Urine Culture result 1 Comment       Assessment & Plan:   Problem List Items Addressed This Visit      Nervous and Auditory   Dementia    Stable. Continue current regimen. Checking labs today. Recheck 3 months.       Relevant Orders   CBC with Differential/Platelet   Comprehensive  metabolic panel   TSH     Genitourinary   Benign hypertensive renal disease    BP very labile. Better on recheck. More concerned about falling and orthostasis right now. Caregivers and family aware of risks. Continue current regimen.         Relevant Orders   CBC with Differential/Platelet   Comprehensive metabolic panel   TSH     Other   Malnutrition (HCC) - Primary    Weight up by 2 pounds! Will stay off the remeron. Continue to monitor. Continue ensure. Recheck 3 months.       Relevant Orders   CBC with Differential/Platelet   Comprehensive metabolic panel   TSH    Other Visit Diagnoses    Screening for cholesterol level        Checking labs today. Await results. Continue to monitor.     Relevant Orders    Lipid Panel w/o Chol/HDL Ratio        Follow up plan: Return in about 3 months (around 08/20/2015) for follow up.

## 2015-05-22 LAB — CBC WITH DIFFERENTIAL/PLATELET
BASOS ABS: 0 10*3/uL (ref 0.0–0.2)
Basos: 0 %
EOS (ABSOLUTE): 0.1 10*3/uL (ref 0.0–0.4)
EOS: 2 %
HEMATOCRIT: 37.1 % (ref 34.0–46.6)
HEMOGLOBIN: 12.2 g/dL (ref 11.1–15.9)
Immature Grans (Abs): 0 10*3/uL (ref 0.0–0.1)
Immature Granulocytes: 0 %
LYMPHS ABS: 1.7 10*3/uL (ref 0.7–3.1)
Lymphs: 27 %
MCH: 29.1 pg (ref 26.6–33.0)
MCHC: 32.9 g/dL (ref 31.5–35.7)
MCV: 89 fL (ref 79–97)
MONOCYTES: 8 %
Monocytes Absolute: 0.5 10*3/uL (ref 0.1–0.9)
NEUTROS ABS: 3.8 10*3/uL (ref 1.4–7.0)
Neutrophils: 63 %
Platelets: 283 10*3/uL (ref 150–379)
RBC: 4.19 x10E6/uL (ref 3.77–5.28)
RDW: 14.4 % (ref 12.3–15.4)
WBC: 6.1 10*3/uL (ref 3.4–10.8)

## 2015-05-22 LAB — COMPREHENSIVE METABOLIC PANEL
A/G RATIO: 1.1 — AB (ref 1.2–2.2)
ALBUMIN: 3.3 g/dL (ref 3.2–4.6)
ALK PHOS: 78 IU/L (ref 39–117)
ALT: 38 IU/L — ABNORMAL HIGH (ref 0–32)
AST: 40 IU/L (ref 0–40)
BUN / CREAT RATIO: 21 (ref 12–28)
BUN: 14 mg/dL (ref 10–36)
CHLORIDE: 101 mmol/L (ref 96–106)
CO2: 29 mmol/L (ref 18–29)
Calcium: 9.6 mg/dL (ref 8.7–10.3)
Creatinine, Ser: 0.68 mg/dL (ref 0.57–1.00)
GFR calc non Af Amer: 77 mL/min/{1.73_m2} (ref >59)
GFR, EST AFRICAN AMERICAN: 88 mL/min/{1.73_m2} (ref >59)
GLOBULIN, TOTAL: 3 g/dL (ref 1.5–4.5)
Glucose: 86 mg/dL (ref 65–99)
Potassium: 4.8 mmol/L (ref 3.5–5.2)
Sodium: 144 mmol/L (ref 134–144)
Total Protein: 6.3 g/dL (ref 6.0–8.5)

## 2015-05-22 LAB — LIPID PANEL W/O CHOL/HDL RATIO
Cholesterol, Total: 203 mg/dL — ABNORMAL HIGH (ref 100–199)
HDL: 71 mg/dL (ref >39)
LDL Calculated: 115 mg/dL — ABNORMAL HIGH (ref 0–99)
Triglycerides: 86 mg/dL (ref 0–149)
VLDL CHOLESTEROL CAL: 17 mg/dL (ref 5–40)

## 2015-05-22 LAB — TSH: TSH: 2.39 u[IU]/mL (ref 0.450–4.500)

## 2015-06-05 ENCOUNTER — Other Ambulatory Visit: Payer: Self-pay | Admitting: Family Medicine

## 2015-08-06 ENCOUNTER — Encounter: Payer: Self-pay | Admitting: Emergency Medicine

## 2015-08-06 ENCOUNTER — Emergency Department
Admission: EM | Admit: 2015-08-06 | Discharge: 2015-08-06 | Disposition: A | Discharge status: Home / Self Care | Payer: Medicare Other | Attending: Emergency Medicine | Admitting: Emergency Medicine

## 2015-08-06 DIAGNOSIS — M1990 Unspecified osteoarthritis, unspecified site: Secondary | ICD-10-CM | POA: Diagnosis not present

## 2015-08-06 DIAGNOSIS — I10 Essential (primary) hypertension: Secondary | ICD-10-CM | POA: Insufficient documentation

## 2015-08-06 DIAGNOSIS — G301 Alzheimer's disease with late onset: Secondary | ICD-10-CM | POA: Diagnosis not present

## 2015-08-06 DIAGNOSIS — F0390 Unspecified dementia without behavioral disturbance: Secondary | ICD-10-CM | POA: Diagnosis not present

## 2015-08-06 DIAGNOSIS — R4182 Altered mental status, unspecified: Secondary | ICD-10-CM | POA: Diagnosis present

## 2015-08-06 DIAGNOSIS — Z79899 Other long term (current) drug therapy: Secondary | ICD-10-CM | POA: Diagnosis not present

## 2015-08-06 LAB — COMPREHENSIVE METABOLIC PANEL
ALBUMIN: 2.9 g/dL — AB (ref 3.5–5.0)
ALK PHOS: 66 U/L (ref 38–126)
ALT: 37 U/L (ref 14–54)
ANION GAP: 8 (ref 5–15)
AST: 47 U/L — ABNORMAL HIGH (ref 15–41)
BILIRUBIN TOTAL: 0.4 mg/dL (ref 0.3–1.2)
BUN: 19 mg/dL (ref 6–20)
CALCIUM: 9.4 mg/dL (ref 8.9–10.3)
CO2: 30 mmol/L (ref 22–32)
Chloride: 100 mmol/L — ABNORMAL LOW (ref 101–111)
Creatinine, Ser: 0.86 mg/dL (ref 0.44–1.00)
GFR, EST NON AFRICAN AMERICAN: 57 mL/min — AB (ref >60)
GLUCOSE: 102 mg/dL — AB (ref 65–99)
Potassium: 4.5 mmol/L (ref 3.5–5.1)
Sodium: 138 mmol/L (ref 135–145)
TOTAL PROTEIN: 6.4 g/dL — AB (ref 6.5–8.1)

## 2015-08-06 LAB — URINALYSIS COMPLETE WITH MICROSCOPIC (ARMC ONLY)
BACTERIA UA: NONE SEEN
Bilirubin Urine: NEGATIVE
GLUCOSE, UA: NEGATIVE mg/dL
Hgb urine dipstick: NEGATIVE
Ketones, ur: NEGATIVE mg/dL
Leukocytes, UA: NEGATIVE
NITRITE: NEGATIVE
Protein, ur: NEGATIVE mg/dL
SPECIFIC GRAVITY, URINE: 1.015 (ref 1.005–1.030)
Squamous Epithelial / LPF: NONE SEEN
pH: 6 (ref 5.0–8.0)

## 2015-08-06 LAB — CBC WITH DIFFERENTIAL/PLATELET
BASOS ABS: 0 10*3/uL (ref 0–0.1)
BASOS PCT: 1 %
Eosinophils Absolute: 0.2 10*3/uL (ref 0–0.7)
Eosinophils Relative: 3 %
HEMATOCRIT: 37.8 % (ref 35.0–47.0)
HEMOGLOBIN: 12.1 g/dL (ref 12.0–16.0)
Lymphocytes Relative: 27 %
Lymphs Abs: 1.8 10*3/uL (ref 1.0–3.6)
MCH: 28.4 pg (ref 26.0–34.0)
MCHC: 32.1 g/dL (ref 32.0–36.0)
MCV: 88.7 fL (ref 80.0–100.0)
Monocytes Absolute: 0.6 10*3/uL (ref 0.2–0.9)
Monocytes Relative: 9 %
NEUTROS ABS: 4 10*3/uL (ref 1.4–6.5)
NEUTROS PCT: 60 %
Platelets: 213 10*3/uL (ref 150–440)
RBC: 4.26 MIL/uL (ref 3.80–5.20)
RDW: 15.3 % — ABNORMAL HIGH (ref 11.5–14.5)
WBC: 6.5 10*3/uL (ref 3.6–11.0)

## 2015-08-06 NOTE — ED Notes (Signed)
Dr Forbach and Butch, RN at bedside at this time. 

## 2015-08-06 NOTE — ED Notes (Signed)
[facility] Staff report pt walking around more than normal and behavior is not normal today. EMS reports CBG 120, 143/55, 100% RA. Pt oriented to person only in triage. Pt has a history of dementia as reported by EMS.

## 2015-08-06 NOTE — ED Provider Notes (Signed)
[facility] Emergency Department Provider Note  ____________________________________________  Time seen: Approximately 7:46 PM  I have reviewed the triage vital signs and the nursing notes.   HISTORY  Chief Complaint Altered Mental Status    HPI Mary [name] is a 82 y.o. female with history of chronic dementia who lives at an assisted living facility.  She presents by EMS for evaluation after the employees at the facility felt that her behavior was different than usual today.  She is apparently walking around more than usual and seems "glazed" and more confused than usual.  Her family is present at the bedside and said that her mental status does wax and wane, and she does seem to get worse when she has a urinary tract infection.  She is in no acute distress.  She is confused as to her location but she knows her family.  She denies any pain of any sort.  Overall her symptoms were reportedly moderate in severity but right now her family feels that she is approximately at her baseline.  She has had no recent history of falls although she has a falls risk.  She is supposed to use a walker but refuses to do so.  When we were discussing this topic, she said, "I don't use it because I don't need it!"   Past Medical History  Diagnosis Date  . Arthritis   . Hypertension   . Kidney disease   . Frequent falls   . Alzheimer disease   . Seizure [facility](HCC)     Patient Active Problem List   Diagnosis Date Noted  . Abnormal EKG 04/09/2015  . Malnutrition (HCC) 01/23/2015  . Seizures (HCC) 10/17/2014  . Elevated LFTs 08/16/2014  . Dementia 08/15/2014  . Insomnia 08/15/2014  . Benign hypertensive renal disease 08/15/2014    Past Surgical History  Procedure Laterality Date  . Tubal ligation      Current Outpatient Rx  Name  Route  Sig  Dispense  Refill  . acetaminophen (TYLENOL) 325 MG tablet   Oral   Take 650 mg by mouth every 6 (six) hours as needed for mild  pain or headache.          Marland Kitchen. alum & mag hydroxide-simeth (MAALOX/MYLANTA) 200-200-20 MG/5ML suspension   Oral   Take 30 mLs by mouth as needed for indigestion or heartburn.          . ASPIR-LOW 81 MG EC tablet      TAKE 1 TABLET BY MOUTH ONCE DAILY FOR CIRCULATION   30 tablet   12   . bismuth subsalicylate (PEPTO BISMOL) 262 MG/15ML suspension   Oral   Take 30 mLs by mouth every 6 (six) hours as needed (for GI upset).         . Cholecalciferol (VITAMIN D3) 5000 units CAPS   Oral   Take 5,000 Units by mouth.         . Cranberry Fruit 405 MG CAPS      TAKE 1 CAPSULE BY MOUTH TWICE DAILY TO PREVENT UTI   60 each   12   . divalproex (DEPAKOTE) 125 MG DR tablet   Oral   Take 125 mg by mouth 2 (two) times daily.         Marland Kitchen. donepezil (ARICEPT) 10 MG tablet      TAKE 1 TABLET BY MOUTH ONCE DAILY FOR MEMORY   30 tablet   6   . GNP VITAMIN D SUPER STRENGTH 5000 units  TABS      TAKE 1 TABLET BY MOUTH ONCE DAILY FOR SUPPLEMENT   30 each   12     RX HAS EXPIRED. PLEASE REFILL OR D/C. THANK YOU!   . loperamide (IMODIUM A-D) 2 MG tablet   Oral   Take 2 mg by mouth 4 (four) times daily as needed for diarrhea or loose stools.         Marland Kitchen losartan (COZAAR) 50 MG tablet      TAKE 1 TABLET BY MOUTH ONCE DAILY FOR BP   90 tablet   1   . magnesium hydroxide (MILK OF MAGNESIA) 400 MG/5ML suspension   Oral   Take 30 mLs by mouth daily as needed for mild constipation.          . Multiple Vitamin (TAB-A-VITE) TABS      TAKE 1 TABLET BY MOUTH ONCE DAILY FOR SUPPLEMENT   30 each   12   . traZODone (DESYREL) 50 MG tablet      TAKE 1 TABLET BY MOUTH ONCE DAILY AT BEDTIME FOR SLEEP   30 tablet   6     Allergies Review of patient's allergies indicates no known allergies.  Family History  Problem Relation Age of Onset  . Hypertension Mother   . Hypertension Father   . Stroke Mother   . Stroke Sister   . Heart attack Father   . Heart disease Mother   .  Diabetes Sister     Social History Social History  Substance Use Topics  . Smoking status: Never Smoker   . Smokeless tobacco: Never Used  . Alcohol Use: No    Review of Systems Constitutional: No fever/chills Eyes: No visual changes. ENT: No sore throat. Cardiovascular: Denies chest pain. Respiratory: Denies shortness of breath. Gastrointestinal: No abdominal pain.  No nausea, no vomiting.  No diarrhea.  No constipation. Genitourinary: Negative for dysuria. Musculoskeletal: Negative for back pain. Skin: Negative for rash. Neurological: Negative for headaches, focal weakness or numbness.  10-point ROS otherwise negative.  ____________________________________________   PHYSICAL EXAM:  VITAL SIGNS: ED Triage Vitals  Enc Vitals Group     BP 08/06/15 1838 154/67 mmHg     Pulse Rate 08/06/15 1838 65     Resp 08/06/15 1838 18     Temp 08/06/15 1838 97.6 F (36.4 C)     Temp Source 08/06/15 1838 Oral     SpO2 08/06/15 1838 95 %     Weight 08/06/15 1838 103 lb (46.72 kg)     Height 08/06/15 1838 5\' 3"  (1.6 m)     Head Cir --      Peak Flow --      Pain Score --      Pain Loc --      Pain Edu? --      Excl. in GC? --     Constitutional: Alert and oriented. Well appearing For age and in no acute distress. Eyes: Conjunctivae are normal. PERRL. EOMI. Head: Atraumatic. Nose: No congestion/rhinnorhea. Mouth/Throat: Mucous membranes are moist.  Oropharynx non-erythematous. Neck: No stridor.  No meningeal signs.  No cervical spine tenderness to palpation. Cardiovascular: Normal rate, regular rhythm. Good peripheral circulation. Grossly normal heart sounds.   Respiratory: Normal respiratory effort.  No retractions. Lungs CTAB. Gastrointestinal: Soft and nontender. No distention.  Musculoskeletal: No lower extremity tenderness nor edema. No gross deformities of extremities. Neurologic:  Normal speech and language. No gross focal neurologic deficits are appreciated.  Skin:   Skin  is warm, dry and intact. No rash noted. Psychiatric: Mood and affect are normal. Speech and behavior are normal.  ____________________________________________   LABS (all labs ordered are listed, but only abnormal results are displayed)  Labs Reviewed  URINALYSIS COMPLETEWITH MICROSCOPIC ([facility] ONLY) - Abnormal; Notable for the following:    Color, Urine YELLOW (*)    APPearance CLEAR (*)    All other components within normal limits  COMPREHENSIVE METABOLIC PANEL - Abnormal; Notable for the following:    Chloride 100 (*)    Glucose, Bld 102 (*)    Total Protein 6.4 (*)    Albumin 2.9 (*)    AST 47 (*)    GFR calc non Af Amer 57 (*)    All other components within normal limits  CBC WITH DIFFERENTIAL/PLATELET - Abnormal; Notable for the following:    RDW 15.3 (*)    All other components within normal limits   ____________________________________________  EKG  None ____________________________________________  RADIOLOGY   No results found.  ____________________________________________   PROCEDURES  Procedure(s) performed:   Procedures   ____________________________________________   INITIAL IMPRESSION / ASSESSMENT AND PLAN / ED COURSE  Pertinent labs & imaging results that were available during my care of the patient were reviewed by me and considered in my medical decision making (see chart for details).  I had a good discussion with the patient's daughter and the rest of her family.  They are comfortable with the plan to check basic labs and a urinalysis and avoid any head CT since the patient has no signs or symptoms of CVA and no recent history of traumatic injury.  She is alert, feisty, and in no acute distress.  I anticipate discharge home if her lab results are within normal limits.  ----------------------------------------- 9:12 PM on 08/06/2015 -----------------------------------------  Lab work and urinalysis are normal.  I discussed the  results with the patient's family and they are comfortable with her going back to her living facility.  I gave my usual and customary return precautions.    ____________________________________________  FINAL CLINICAL IMPRESSION(S) / ED DIAGNOSES  Final diagnoses:  Chronic dementia, without behavioral disturbance     MEDICATIONS GIVEN DURING THIS VISIT:  Medications - No data to display   NEW OUTPATIENT MEDICATIONS STARTED DURING THIS VISIT:  New Prescriptions   No medications on file      Note:  This document was prepared using Dragon voice recognition software and may include unintentional dictation errors.   Loleta Rose, MD 08/06/15 2113

## 2015-08-06 NOTE — Discharge Instructions (Signed)
As we discussed, your workup today was reassuring.  Though we do not know exactly what is causing your symptoms, it appears that you have no emergent medical condition at this time are safe to go home and follow up as recommended in this paperwork. ° °Please return immediately to the Emergency Department if you develop any new or worsening symptoms that concern you. ° ° °Dementia °Dementia is a general term for problems with brain function. A person with dementia has memory loss and a hard time with at least one other brain function such as thinking, speaking, or problem solving. Dementia can affect social functioning, how you do your job, your mood, or your personality. The changes may be hidden for a long time. The earliest forms of this disease are usually not detected by family or friends. °Dementia can be: °· Irreversible. °· Potentially reversible. °· Partially reversible. °· Progressive. This means it can get worse over time. °CAUSES  °Irreversible dementia causes may include: °· Degeneration of brain cells (Alzheimer disease or Lewy body dementia). °· Multiple small strokes (vascular dementia). °· Infection (chronic meningitis or Creutzfeldt-Jakob disease). °· Frontotemporal dementia. This affects younger people, age 40 to 70, compared to those who have Alzheimer disease. °· Dementia associated with other disorders like Parkinson disease, Huntington disease, or HIV-associated dementia. °Potentially or partially reversible dementia causes may include: °· Medicines. °· Metabolic causes such as excessive alcohol intake, vitamin B12 deficiency, or thyroid disease. °· Masses or pressure in the brain such as a tumor, blood clot, or hydrocephalus. °SIGNS AND SYMPTOMS  °Symptoms are often hard to detect. Family members or coworkers may not notice them early in the disease process. Different people with dementia may have different symptoms. Symptoms can include: °· A hard time with memory, especially recent memory.  Long-term memory may not be impaired. °· Asking the same question multiple times or forgetting something someone just said. °· A hard time speaking your thoughts or finding certain words. °· A hard time solving problems or performing familiar tasks (such as how to use a telephone). °· Sudden changes in mood. °· Changes in personality, especially increasing moodiness or mistrust. °· Depression. °· A hard time understanding complex ideas that were never a problem in the past. °DIAGNOSIS  °There are no specific tests for dementia.  °· Your health care provider may recommend a thorough evaluation. This is because some forms of dementia can be reversible. The evaluation will likely include a physical exam and getting a detailed history from you and a family member. The history often gives the best clues and suggestions for a diagnosis. °· Memory testing may be done. A detailed brain function evaluation called neuropsychologic testing may be helpful. °· Lab tests and brain imaging (such as a CT scan or MRI scan) are sometimes important. °· Sometimes observation and re-evaluation over time is very helpful. °TREATMENT  °Treatment depends on the cause.  °· If the problem is a vitamin deficiency, it may be helped or cured with supplements. °· For dementias such as Alzheimer disease, medicines are available to stabilize or slow the course of the disease. There are no cures for this type of dementia. °· Your health care provider can help direct you to groups, organizations, and other health care providers to help with decisions in the care of you or your loved one. °HOME CARE INSTRUCTIONS °The care of individuals with dementia is varied and dependent upon the progression of the dementia. The following suggestions are intended for the person living   with, or caring for, the person with dementia. °· Create a safe environment. °¨ Remove the locks on bathroom doors to prevent the person from accidentally locking himself or herself  in. °¨ Use childproof latches on kitchen cabinets and any place where cleaning supplies, chemicals, or alcohol are kept. °¨ Use childproof covers in unused electrical outlets. °¨ Install childproof devices to keep doors and windows secured. °¨ Remove stove knobs or install safety knobs and an automatic shut-off on the stove. °¨ Lower the temperature on water heaters. °¨ Label medicines and keep them locked up. °¨ Secure knives, lighters, matches, power tools, and guns, and keep these items out of reach. °¨ Keep the house free from clutter. Remove rugs or anything that might contribute to a fall. °¨ Remove objects that might break and hurt the person. °¨ Make sure lighting is good, both inside and outside. °¨ Install grab rails as needed. °¨ Use a monitoring device to alert you to falls or other needs for help. °· Reduce confusion. °¨ Keep familiar objects and people around. °¨ Use night lights or dim lights at night. °¨ Label items or areas. °¨ Use reminders, notes, or directions for daily activities or tasks. °¨ Keep a simple, consistent routine for waking, meals, bathing, dressing, and bedtime. °¨ Create a calm, quiet environment. °¨ Place large clocks and calendars prominently. °¨ Display emergency numbers and home address near all telephones. °¨ Use cues to establish different times of the day. An example is to open curtains to let the natural light in during the day.   °· Use effective communication. °¨ Choose simple words and short sentences. °¨ Use a gentle, calm tone of voice. °¨ Be careful not to interrupt. °¨ If the person is struggling to find a word or communicate a thought, try to provide the word or thought. °¨ Ask one question at a time. Allow the person ample time to answer questions. Repeat the question again if the person does not respond. °· Reduce nighttime restlessness. °¨ Provide a comfortable bed. °¨ Have a consistent nighttime routine. °¨ Ensure a regular walking or physical activity  schedule. Involve the person in daily activities as much as possible. °¨ Limit napping during the day. °¨ Limit caffeine. °¨ Attend social events that stimulate rather than overwhelm the senses. °· Encourage good nutrition and hydration. °¨ Reduce distractions during meal times and snacks. °¨ Avoid foods that are too hot or too cold. °¨ Monitor chewing and swallowing ability. °· Continue with routine vision, hearing, dental, and medical screenings. °· Give medicines only as directed by the health care provider. °· Monitor driving abilities. Do not allow the person to drive when safe driving is no longer possible. °· Register with an identification program which could provide location assistance in the event of a missing person situation. °SEEK MEDICAL CARE IF:  °· New behavioral problems start such as moodiness, aggressiveness, or seeing things that are not there (hallucinations). °· Any new problem with brain function happens. This includes problems with balance, speech, or falling a lot. °· Problems with swallowing develop. °· Any symptoms of other illness happen. °Small changes or worsening in any aspect of brain function can be a sign that the illness is getting worse. It can also be a sign of another medical illness such as infection. Seeing a health care provider right away is important. °SEEK IMMEDIATE MEDICAL CARE IF:  °· A fever develops. °· New or worsened confusion develops. °· New or worsened sleepiness develops. °·   Staying awake becomes hard to do. °  °This information is not intended to replace advice given to you by your health care provider. Make sure you discuss any questions you have with your health care provider. °  °Document Released: 07/30/2000 Document Revised: 02/24/2014 Document Reviewed: 07/01/2010 °Elsevier Interactive Patient Education ©2016 [employer] ° °

## 2015-08-19 ENCOUNTER — Inpatient Hospital Stay (HOSPITAL_COMMUNITY)
Admit: 2015-08-19 | Discharge: 2015-08-19 | Disposition: A | Discharge status: Home / Self Care | Payer: Medicare Other | Attending: Internal Medicine | Admitting: Internal Medicine

## 2015-08-19 ENCOUNTER — Inpatient Hospital Stay: Payer: Medicare Other

## 2015-08-19 ENCOUNTER — Inpatient Hospital Stay
Admission: EM | Admit: 2015-08-19 | Discharge: 2015-08-22 | DRG: 065 | Disposition: A | Discharge status: Skilled Nursing Facility | Payer: Medicare Other | Attending: Internal Medicine | Admitting: Internal Medicine

## 2015-08-19 ENCOUNTER — Emergency Department: Payer: Medicare Other

## 2015-08-19 DIAGNOSIS — I10 Essential (primary) hypertension: Secondary | ICD-10-CM | POA: Diagnosis present

## 2015-08-19 DIAGNOSIS — G8191 Hemiplegia, unspecified affecting right dominant side: Secondary | ICD-10-CM | POA: Diagnosis present

## 2015-08-19 DIAGNOSIS — Z833 Family history of diabetes mellitus: Secondary | ICD-10-CM

## 2015-08-19 DIAGNOSIS — Z823 Family history of stroke: Secondary | ICD-10-CM | POA: Diagnosis not present

## 2015-08-19 DIAGNOSIS — G309 Alzheimer's disease, unspecified: Secondary | ICD-10-CM | POA: Diagnosis present

## 2015-08-19 DIAGNOSIS — F0280 Dementia in other diseases classified elsewhere without behavioral disturbance: Secondary | ICD-10-CM | POA: Diagnosis present

## 2015-08-19 DIAGNOSIS — G459 Transient cerebral ischemic attack, unspecified: Secondary | ICD-10-CM

## 2015-08-19 DIAGNOSIS — R569 Unspecified convulsions: Secondary | ICD-10-CM

## 2015-08-19 DIAGNOSIS — I4891 Unspecified atrial fibrillation: Secondary | ICD-10-CM | POA: Diagnosis present

## 2015-08-19 DIAGNOSIS — I639 Cerebral infarction, unspecified: Secondary | ICD-10-CM | POA: Diagnosis not present

## 2015-08-19 DIAGNOSIS — M6289 Other specified disorders of muscle: Secondary | ICD-10-CM | POA: Diagnosis not present

## 2015-08-19 DIAGNOSIS — R29898 Other symptoms and signs involving the musculoskeletal system: Secondary | ICD-10-CM

## 2015-08-19 DIAGNOSIS — Z66 Do not resuscitate: Secondary | ICD-10-CM | POA: Diagnosis present

## 2015-08-19 DIAGNOSIS — Z79899 Other long term (current) drug therapy: Secondary | ICD-10-CM | POA: Diagnosis not present

## 2015-08-19 DIAGNOSIS — M1990 Unspecified osteoarthritis, unspecified site: Secondary | ICD-10-CM | POA: Diagnosis present

## 2015-08-19 DIAGNOSIS — Z9851 Tubal ligation status: Secondary | ICD-10-CM

## 2015-08-19 DIAGNOSIS — R531 Weakness: Secondary | ICD-10-CM

## 2015-08-19 DIAGNOSIS — G40909 Epilepsy, unspecified, not intractable, without status epilepticus: Secondary | ICD-10-CM | POA: Diagnosis present

## 2015-08-19 DIAGNOSIS — R739 Hyperglycemia, unspecified: Secondary | ICD-10-CM | POA: Diagnosis present

## 2015-08-19 DIAGNOSIS — Z8249 Family history of ischemic heart disease and other diseases of the circulatory system: Secondary | ICD-10-CM | POA: Diagnosis not present

## 2015-08-19 DIAGNOSIS — Z7982 Long term (current) use of aspirin: Secondary | ICD-10-CM | POA: Diagnosis not present

## 2015-08-19 LAB — CBC
HCT: 35.9 % (ref 35.0–47.0)
Hemoglobin: 12.1 g/dL (ref 12.0–16.0)
MCH: 29.1 pg (ref 26.0–34.0)
MCHC: 33.7 g/dL (ref 32.0–36.0)
MCV: 86.5 fL (ref 80.0–100.0)
PLATELETS: 191 10*3/uL (ref 150–440)
RBC: 4.15 MIL/uL (ref 3.80–5.20)
RDW: 15 % — ABNORMAL HIGH (ref 11.5–14.5)
WBC: 8 10*3/uL (ref 3.6–11.0)

## 2015-08-19 LAB — BASIC METABOLIC PANEL
ANION GAP: 7 (ref 5–15)
BUN: 13 mg/dL (ref 6–20)
CALCIUM: 9 mg/dL (ref 8.9–10.3)
CO2: 30 mmol/L (ref 22–32)
CREATININE: 0.67 mg/dL (ref 0.44–1.00)
Chloride: 102 mmol/L (ref 101–111)
Glucose, Bld: 110 mg/dL — ABNORMAL HIGH (ref 65–99)
Potassium: 4.4 mmol/L (ref 3.5–5.1)
SODIUM: 139 mmol/L (ref 135–145)

## 2015-08-19 LAB — URINALYSIS COMPLETE WITH MICROSCOPIC (ARMC ONLY)
Bilirubin Urine: NEGATIVE
GLUCOSE, UA: NEGATIVE mg/dL
Hgb urine dipstick: NEGATIVE
Ketones, ur: NEGATIVE mg/dL
Nitrite: NEGATIVE
PH: 7 (ref 5.0–8.0)
PROTEIN: 30 mg/dL — AB
SPECIFIC GRAVITY, URINE: 1.014 (ref 1.005–1.030)
Squamous Epithelial / LPF: NONE SEEN

## 2015-08-19 LAB — MRSA PCR SCREENING: MRSA BY PCR: NEGATIVE

## 2015-08-19 LAB — ECHOCARDIOGRAM COMPLETE
AV Area VTI: 1.02 cm2
AV Peak grad: 13 mmHg
AV pk vel: 181 cm/s
Ao pk vel: 0.57 m/s
CHL CUP AV PEAK INDEX: 0.7
FS: 29 % (ref 28–44)
IV/PV OW: 1.4
LA diam end sys: 35 mm
LA vol A4C: 73.3 ml
LADIAMINDEX: 2.4 cm/m2
LASIZE: 35 mm
LAVOL: 69.4 mL
LAVOLIN: 47.5 mL/m2
LVOT area: 1.77 cm2
LVOT diameter: 15 mm
LVOTPV: 104 cm/s
PW: 8.66 mm — AB (ref 0.6–1.1)
TVMG: 320 mmHg

## 2015-08-19 LAB — VALPROIC ACID LEVEL: Valproic Acid Lvl: 16 ug/mL — ABNORMAL LOW (ref 50.0–100.0)

## 2015-08-19 LAB — TROPONIN I

## 2015-08-19 LAB — HEMOGLOBIN A1C: Hgb A1c MFr Bld: 5.9 % (ref 4.0–6.0)

## 2015-08-19 MED ORDER — ENOXAPARIN SODIUM 30 MG/0.3ML ~~LOC~~ SOLN
30.0000 mg | SUBCUTANEOUS | Status: DC
  Administered 2015-08-19: 30 mg via SUBCUTANEOUS
  Filled 2015-08-19: qty 0.3

## 2015-08-19 MED ORDER — LOPERAMIDE HCL 2 MG PO TABS
2.0000 mg | ORAL_TABLET | Freq: Four times a day (QID) | ORAL | Status: DC | PRN
  Filled 2015-08-19: qty 1

## 2015-08-19 MED ORDER — BISMUTH SUBSALICYLATE 262 MG/15ML PO SUSP
30.0000 mL | Freq: Four times a day (QID) | ORAL | Status: DC | PRN
  Filled 2015-08-19: qty 118

## 2015-08-19 MED ORDER — DIVALPROEX SODIUM 125 MG PO CSDR
250.0000 mg | DELAYED_RELEASE_CAPSULE | Freq: Two times a day (BID) | ORAL | Status: DC
  Administered 2015-08-19 – 2015-08-22 (×7): 250 mg via ORAL
  Filled 2015-08-19 (×8): qty 2
  Filled 2015-08-19: qty 1

## 2015-08-19 MED ORDER — LORAZEPAM 2 MG/ML IJ SOLN
0.5000 mg | Freq: Once | INTRAMUSCULAR | Status: AC
  Administered 2015-08-19: 0.5 mg via INTRAVENOUS
  Filled 2015-08-19: qty 1

## 2015-08-19 MED ORDER — DONEPEZIL HCL 5 MG PO TABS
10.0000 mg | ORAL_TABLET | Freq: Every day | ORAL | Status: DC
  Administered 2015-08-19 – 2015-08-21 (×3): 10 mg via ORAL
  Filled 2015-08-19 (×3): qty 2

## 2015-08-19 MED ORDER — ASPIRIN EC 325 MG PO TBEC
325.0000 mg | DELAYED_RELEASE_TABLET | Freq: Every day | ORAL | Status: DC
  Administered 2015-08-19 – 2015-08-20 (×2): 325 mg via ORAL
  Filled 2015-08-19 (×2): qty 1

## 2015-08-19 MED ORDER — ALUM & MAG HYDROXIDE-SIMETH 200-200-20 MG/5ML PO SUSP: 30.0000 mL | ORAL | Status: DC | PRN

## 2015-08-19 MED ORDER — ACETAMINOPHEN 325 MG PO TABS
325.0000 mg | ORAL_TABLET | Freq: Four times a day (QID) | ORAL | Status: DC | PRN
  Filled 2015-08-19: qty 2

## 2015-08-19 MED ORDER — TRAZODONE HCL 50 MG PO TABS
50.0000 mg | ORAL_TABLET | Freq: Every day | ORAL | Status: DC
  Administered 2015-08-19 – 2015-08-21 (×3): 50 mg via ORAL
  Filled 2015-08-19 (×3): qty 1

## 2015-08-19 MED ORDER — ONDANSETRON HCL 4 MG/2ML IJ SOLN: 4.0000 mg | Freq: Four times a day (QID) | INTRAMUSCULAR | Status: DC | PRN

## 2015-08-19 MED ORDER — LOSARTAN POTASSIUM 50 MG PO TABS
50.0000 mg | ORAL_TABLET | Freq: Every day | ORAL | Status: DC
  Administered 2015-08-19 – 2015-08-22 (×4): 50 mg via ORAL
  Filled 2015-08-19 (×4): qty 1

## 2015-08-19 MED ORDER — DOCUSATE SODIUM 100 MG PO CAPS
100.0000 mg | ORAL_CAPSULE | Freq: Two times a day (BID) | ORAL | Status: DC
  Administered 2015-08-19 – 2015-08-22 (×5): 100 mg via ORAL
  Filled 2015-08-19 (×5): qty 1

## 2015-08-19 MED ORDER — ONDANSETRON HCL 4 MG PO TABS: 4.0000 mg | ORAL_TABLET | Freq: Four times a day (QID) | ORAL | Status: DC | PRN

## 2015-08-19 MED ORDER — MAGNESIUM HYDROXIDE 400 MG/5ML PO SUSP: 30.0000 mL | Freq: Every day | ORAL | Status: DC | PRN

## 2015-08-19 MED ORDER — LATANOPROST 0.005 % OP SOLN
1.0000 [drp] | Freq: Every day | OPHTHALMIC | Status: DC
  Administered 2015-08-19 – 2015-08-21 (×3): 1 [drp] via OPHTHALMIC
  Filled 2015-08-19 (×2): qty 2.5

## 2015-08-19 NOTE — Progress Notes (Addendum)
[name] is a 82 y.o. female patient admitted from ED awake, alert - oriented  X 2 - no acute distress noted.  VSS - Blood pressure 162/81, pulse 77, temperature 97.6 F (36.4 C), temperature source Oral, resp. rate 16, SpO2 100 %.    IV in place, occlusive dsg intact without redness.  Orientation to room, and floor completed with information packet given to patient/family.  Patient declined safety video at this time.  Admission INP armband ID verified with patient/family, and in place.   SR up x 2, fall assessment complete, with patient and family able to verbalize understanding of risk associated with falls, and verbalized understanding to call nsg before up out of bed.  Call light within reach, patient able to voice, and demonstrate understanding.  Skin, clean-dry- intact without evidence of bruising, or skin tears.   No evidence of skin break down noted on exam. Skin assessed with Allayne ButcherSusan P. Tele box verified with Kemper Durieonnisha, RN and Oct[place]eredith, [place]. Patient in NSR.  Family at bedside.      Will cont to eval and treat per MD orders.  Rudean Haskellonnisha R Seraya Jobst, RN 08/19/2015 1:32 PM

## 2015-08-19 NOTE — Progress Notes (Signed)
*  PRELIMINARY RESULTS* Echocardiogram 2D Echocardiogram has been performed.  Mary [name] 08/19/2015, 2:38 PM

## 2015-08-19 NOTE — ED Notes (Signed)
Tried connecting with SOC. Unable to connect. SOC is contacting IT

## 2015-08-19 NOTE — ED Notes (Signed)
Patient transported to CT with primary RN. 

## 2015-08-19 NOTE — ED Notes (Signed)
Pt came to ED via EMS from nursing home. Per staff at nursing home pt had syncopal episode. Was eating breakfast and found slumped over in chair. Normally alert and oriented and can ambulate. Upon arrival pt is alert, follows some commands, not responding to questions.

## 2015-08-19 NOTE — ED Provider Notes (Addendum)
[facility] Emergency Department Provider Note   ____________________________________________  Time seen: Approximately 7:45am I have reviewed the triage vital signs and the triage nursing note.  HISTORY  Chief Complaint Loss of Consciousness   Historian Limited - Dementia Patient Nursing home report EMS  HPI Mary [name] is a 82 y.o. female with a history of dementia, as well as seizures for which she is on Depakote, here for evaluation of altered mental status episode. I spoke with the care home provider who states that she woke this patient up around 5:30 and started to get her ready for a shower and she was in her normal and usual state of health which is walking on her own and alert and conversant. The patient was placed in the dining area around 6 or 6:15. When the care home worker checked in on this resident again around 7 AM, the patient was seen slumped over in the chair with her chin to her chest and with foam at her mouth. The staff worker called to her and the patient finally responded. The staff worker called EMS. EMS had some concern about right-sided weakness.  On arrival the patient is able to answer a few yes no questions and follow some commands, although cooperation with a full neurologic exam is very difficult.    Past Medical History  Diagnosis Date  . Arthritis   . Hypertension   . Kidney disease   . Frequent falls   . Alzheimer disease   . Seizure [facility](HCC)     Patient Active Problem List   Diagnosis Date Noted  . Abnormal EKG 04/09/2015  . Malnutrition (HCC) 01/23/2015  . Seizures (HCC) 10/17/2014  . Elevated LFTs 08/16/2014  . Dementia 08/15/2014  . Insomnia 08/15/2014  . Benign hypertensive renal disease 08/15/2014    Past Surgical History  Procedure Laterality Date  . Tubal ligation      Current Outpatient Rx  Name  Route  Sig  Dispense  Refill  . acetaminophen (TYLENOL) 325 MG tablet   Oral   Take 325-650 mg by  mouth every 6 (six) hours as needed for mild pain or headache.          Marland Kitchen. alum & mag hydroxide-simeth (MAALOX/MYLANTA) 200-200-20 MG/5ML suspension   Oral   Take 30 mLs by mouth as needed for indigestion or heartburn.          . ASPIR-LOW 81 MG EC tablet      TAKE 1 TABLET BY MOUTH ONCE DAILY FOR CIRCULATION   30 tablet   12   . bismuth subsalicylate (PEPTO BISMOL) 262 MG/15ML suspension   Oral   Take 30 mLs by mouth every 6 (six) hours as needed (for GI upset).         . Cranberry Fruit 405 MG CAPS      TAKE 1 CAPSULE BY MOUTH TWICE DAILY TO PREVENT UTI   60 each   12   . divalproex (DEPAKOTE SPRINKLE) 125 MG capsule   Oral   Take 125 mg by mouth 2 (two) times daily.         Marland Kitchen. donepezil (ARICEPT) 10 MG tablet      TAKE 1 TABLET BY MOUTH ONCE DAILY FOR MEMORY   30 tablet   6   . GNP VITAMIN D SUPER STRENGTH 5000 units TABS      TAKE 1 TABLET BY MOUTH ONCE DAILY FOR SUPPLEMENT   30 each   12     RX HAS  EXPIRED. PLEASE REFILL OR D/C. THANK YOU!   . latanoprost (XALATAN) 0.005 % ophthalmic solution   Both Eyes   Place 1 drop into both eyes at bedtime.         Marland Kitchen loperamide (IMODIUM A-D) 2 MG tablet   Oral   Take 2 mg by mouth 4 (four) times daily as needed for diarrhea or loose stools.         . magnesium hydroxide (MILK OF MAGNESIA) 400 MG/5ML suspension   Oral   Take 30 mLs by mouth daily as needed for mild constipation.          . Multiple Vitamin (TAB-A-VITE) TABS      TAKE 1 TABLET BY MOUTH ONCE DAILY FOR SUPPLEMENT   30 each   12   . traZODone (DESYREL) 50 MG tablet      TAKE 1 TABLET BY MOUTH ONCE DAILY AT BEDTIME FOR SLEEP   30 tablet   6   . losartan (COZAAR) 50 MG tablet      TAKE 1 TABLET BY MOUTH ONCE DAILY FOR BP   90 tablet   1     Allergies Review of patient's allergies indicates no known allergies.  Family History  Problem Relation Age of Onset  . Hypertension Mother   . Hypertension Father   . Stroke Mother   .  Stroke Sister   . Heart attack Father   . Heart disease Mother   . Diabetes Sister     Social History Social History  Substance Use Topics  . Smoking status: Never Smoker   . Smokeless tobacco: Never Used  . Alcohol Use: No    Review of Systems Limited, but these of the patient's answers. Constitutional: Negative for Recent illnesses. Eyes: Negative for visual changes. ENT: Negative for sore throat. Cardiovascular: Negative for chest pain. Respiratory: Negative for shortness of breath. Gastrointestinal: Negative for abdominal pain. Genitourinary: Negative for dysuria. Musculoskeletal: Negative for back pain. Skin: Negative for rash. Neurological: Negative for headache. 10 point Review of Systems otherwise negative ____________________________________________   PHYSICAL EXAM:  VITAL SIGNS: ED Triage Vitals  Enc Vitals Group     BP 08/19/15 0734 162/60 mmHg     Pulse Rate 08/19/15 0734 68     Resp 08/19/15 0734 26     Temp 08/19/15 0734 97.8 F (36.6 C)     Temp Source 08/19/15 0734 Oral     SpO2 08/19/15 0734 99 %     Weight --      Height --      Head Cir --      Peak Flow --      Pain Score --      Pain Loc --      Pain Edu? --      Excl. in GC? --      Constitutional: Alert And focusing appropriately. Well appearing overall and in no distress. HEENT   Head: Normocephalic and atraumatic.      Eyes: Conjunctivae are normal. PERRL. Normal extraocular movements.      Ears:         Nose: No congestion/rhinnorhea.   Mouth/Throat: Mucous membranes are moist. Missing teeth.   Neck: No stridor. Cardiovascular/Chest: Normal rate, regular rhythm.  No murmurs, rubs, or gallops. Respiratory: Normal respiratory effort without tachypnea nor retractions. Breath sounds are clear and equal bilaterally. No wheezes/rales/rhonchi. Gastrointestinal: Soft. No distention, no guarding, no rebound. Nontender.    Genitourinary/rectal:Deferred Musculoskeletal:  Nontender with normal range of motion  in all extremities. No joint effusions.  No lower extremity tenderness.  No edema. Neurologic:  No facial droop. Patient's able to say her name and answer some yes no questions, but not really forming sentences. Good strength in left arm and left leg. She does have drift in the right arm and the right leg, although initially it seemed to be an inability to understand what I was saying, as she was able to push her leg down against me, and have trouble comprehending that I was asking her to hold her right arm and right leg up. When I helped him up they did seem to stay up for a short time before drifting down. Unable to assess sensory exam due to patient's mental status at present. Skin:  Skin is warm, dry and intact. No rash noted.  ____________________________________________   EKG I, Governor Rooks, MD, the attending physician have personally viewed and interpreted all ECGs.  70 bpm. Undetermined rhythm. Couplets. Likely atrial fibrillation with PACs. Left axis deviation. Nonspecific intraventricular conduction delay. Nonspecific ST and T-wave ____________________________________________  LABS (pertinent positives/negatives)  Labs Reviewed  BASIC METABOLIC PANEL - Abnormal; Notable for the following:    Glucose, Bld 110 (*)    All other components within normal limits  CBC - Abnormal; Notable for the following:    RDW 15.0 (*)    All other components within normal limits  URINALYSIS COMPLETEWITH MICROSCOPIC ([facility] ONLY) - Abnormal; Notable for the following:    Color, Urine YELLOW (*)    APPearance CLOUDY (*)    Protein, ur 30 (*)    Leukocytes, UA TRACE (*)    Bacteria, UA RARE (*)    All other components within normal limits  VALPROIC ACID LEVEL - Abnormal; Notable for the following:    Valproic Acid Lvl 16 (*)    All other components within normal limits  TROPONIN I  CBG MONITORING, ED     ____________________________________________  RADIOLOGY All Xrays were viewed by me. Imaging interpreted by Radiologist.  CT head without contrast:  IMPRESSION: Chronic atrophic and ischemic changes.  Changes consistent with subacute infarct in the right parietal lobe when compared with the prior exam.  These results were called by telephone at the time of interpretation on 08/19/2015 at 8:37 am to Dr. Governor Rooks , who verbally acknowledged these results. __________________________________________  PROCEDURES  Procedure(s) performed: None  Critical Care performed: CRITICAL CARE Performed by: Governor Rooks   Total critical care time: 30 minutes  Critical care time was exclusive of separately billable procedures and treating other patients.  Critical care was necessary to treat or prevent imminent or life-threatening deterioration.  Critical care was time spent personally by me on the following activities: development of treatment plan with patient and/or surrogate as well as nursing, discussions with consultants, evaluation of patient's response to treatment, examination of patient, obtaining history from patient or surrogate, ordering and performing treatments and interventions, ordering and review of laboratory studies, ordering and review of radiographic studies, pulse oximetry and re-evaluation of patient's condition.   ____________________________________________   ED COURSE / ASSESSMENT AND PLAN  Pertinent labs & imaging results that were available during my care of the patient were reviewed by me and considered in my medical decision making (see chart for details).   Initially the report was that the patient had altered mental status and was slumped over, possible seizure or syncope, but EMS found right-sided weakness. On my initial exam patient seems kind of unable to follow instructions for  neurologic testing, and I placed her for head CT and then called the  nursing home for further history.  Based on speaking with the caretaker, it sounds like this patient typically is able to walk and talk on her own although she does have mild dementia. Based on the caretaker's description, I am more concerned about an acute event including possible stroke given that she does seem to have some drift of the right arm and right leg.  I did make this patient a code stroke and initiate the tele-neurology consultation.  I spoke with the radiologist indicating no acute findings, but a subacute parietal stroke that was new from previous scanning.  I spoke with the tele-neurologist initially before the TV was working, and given the minor and improving deficits I am not inclined to initiate TPA.  The neurologist was able to complete his video conference and does not recommend TPA for same reason. I am going to go ahead and give her 324 aspirin, she is typically on 81 mg.  Additionally, she does have a recent diagnosis of seizures, I question whether or not this could've been a seizure with postictal state now resolved. In either case she is going to be admitted for further workup and monitoring.  Given the low valproic acid level, it's possible that this is a seizure, although without incontinence, witnessed seizure activity, and the neurologic deficits of the right side, I think is most likely that the patient experienced stroke/TIA today.     CONSULTATIONS:  Consult by phone to tele-neurologist Dr. Dione HousekeeperSamuels - recommends no tpa -- Hospital admission with stroke workup. I spoke with the hospitalist team for admission.    Patient / Family / Caregiver informed of clinical course, medical decision-making process, and agree with plan.     ___________________________________________   FINAL CLINICAL IMPRESSION(S) / ED DIAGNOSES   Final diagnoses:  Right arm weakness  Right leg weakness              Note: This dictation was prepared with Dragon  dictation. Any transcriptional errors that result from this process are unintentional   Governor Rooksebecca Pennelope Basque, MD 08/19/15 [id number]  Governor Rooksebecca Azahel Belcastro, MD 08/19/15 [phone]

## 2015-08-19 NOTE — ED Notes (Signed)
Report given Kemper Durieonnisha, RN.

## 2015-08-19 NOTE — Progress Notes (Signed)
*  PRELIMINARY RESULTS* °Echocardiogram °2D Echocardiogram has been performed. ° °Mary [name] °08/19/2015, 2:38 PM °

## 2015-08-19 NOTE — ED Notes (Signed)
Pt improving. Pt now able to respond and follow all commands. Still some weakness on right side.

## 2015-08-19 NOTE — ED Notes (Signed)
This RN introduced self and explained that this RN would be taking over care from [place]. Pt states she needs to go to the bathroom, this RN put patient on bedpan then replaced brief with a clean one. PT appears in NAD. Denies pain at this time. Pt's family at bedside at this time. Will continue to monitor for further patient needs. This RN explained process of admission to patient family, pt family states understanding.

## 2015-08-19 NOTE — Progress Notes (Signed)
Patient lethargic arousing to voice and touch. Able to keep patient awake long enough to give medications. Tolerated well. Unable to complete neuro assessment. Patient unable to follow commands at this time. Will continue to monitor.

## 2015-08-19 NOTE — H&P (Signed)
[facility] [place] [facility]   PATIENT NAME: Mary [name]    MR#:  [id number]  DATE OF BIRTH:  05/05/1921  DATE OF ADMISSION:  08/19/2015  PRIMARY CARE PHYSICIAN: Olevia PerchesMegan Johnson, DO   REQUESTING/REFERRING PHYSICIAN:   CHIEF COMPLAINT:   Chief Complaint  Patient presents with  . Loss of Consciousness    HISTORY OF PRESENT ILLNESS: Mary [name]  is a 82 y.o. female with a known history of Hypertension, Alzheimer's disease, seizure disorder, for which patient is on Depakote who presents to the hospital with complaints of unresponsiveness earlier today. When the patient's family and caregiver. Patient was woken up at around 5:30 AM she was showered cleaned and then taken to the hall to sit down when she was seen later in the morning at around 7 AM. She was noted to be slumped and frothing around her mouth, drooling, she was noted not to respond well. EMS was called and patient was noted to have right-sided weakness, at that time, she was not able to speak. On arrival to the hospital patient had CT scan of the head which revealed subacute right parietal lobe stroke, she was having difficulty moving her right side of body. Because of dementia. It was difficult to evaluate the patient fully neurologically since patient was not able to follow commands. Hospitalist services were contacted for admission.   PAST MEDICAL HISTORY:   Past Medical History  Diagnosis Date  . Arthritis   . Hypertension   . Kidney disease   . Frequent falls   . Alzheimer disease   . Seizure (HCC)     PAST SURGICAL HISTORY: Past Surgical History  Procedure Laterality Date  . Tubal ligation      SOCIAL HISTORY:  Social History  Substance Use Topics  . Smoking status: Never Smoker   . Smokeless tobacco: Never Used  . Alcohol Use: No    FAMILY HISTORY:  Family History  Problem Relation Age of Onset  . Hypertension Mother   . Hypertension Father   . Stroke Mother   . Stroke  Sister   . Heart attack Father   . Heart disease Mother   . Diabetes Sister     DRUG ALLERGIES: No Known Allergies  Review of Systems  Unable to perform ROS: dementia    MEDICATIONS AT HOME:  Prior to Admission medications   Medication Sig Start Date End Date Taking? Authorizing Provider  acetaminophen (TYLENOL) 325 MG tablet Take 325-650 mg by mouth every 6 (six) hours as needed for mild pain or headache.    Yes Historical Provider, MD  alum & mag hydroxide-simeth (MAALOX/MYLANTA) 200-200-20 MG/5ML suspension Take 30 mLs by mouth as needed for indigestion or heartburn.    Yes Historical Provider, MD  ASPIR-LOW 81 MG EC tablet TAKE 1 TABLET BY MOUTH ONCE DAILY FOR CIRCULATION 05/15/15  Yes Megan P Johnson, DO  bismuth subsalicylate (PEPTO BISMOL) 262 MG/15ML suspension Take 30 mLs by mouth every 6 (six) hours as needed (for GI upset).   Yes Historical Provider, MD  Cranberry Fruit 405 MG CAPS TAKE 1 CAPSULE BY MOUTH TWICE DAILY TO PREVENT UTI 06/05/15  Yes Megan P Johnson, DO  divalproex (DEPAKOTE SPRINKLE) 125 MG capsule Take 125 mg by mouth 2 (two) times daily.   Yes Historical Provider, MD  donepezil (ARICEPT) 10 MG tablet TAKE 1 TABLET BY MOUTH ONCE DAILY FOR MEMORY 03/13/15  Yes Megan P Johnson, DO  GNP VITAMIN D SUPER STRENGTH 5000 units TABS  TAKE 1 TABLET BY MOUTH ONCE DAILY FOR SUPPLEMENT 03/12/15  Yes Megan P Johnson, DO  latanoprost (XALATAN) 0.005 % ophthalmic solution Place 1 drop into both eyes at bedtime.   Yes Historical Provider, MD  loperamide (IMODIUM A-D) 2 MG tablet Take 2 mg by mouth 4 (four) times daily as needed for diarrhea or loose stools.   Yes Historical Provider, MD  magnesium hydroxide (MILK OF MAGNESIA) 400 MG/5ML suspension Take 30 mLs by mouth daily as needed for mild constipation.    Yes Historical Provider, MD  Multiple Vitamin (TAB-A-VITE) TABS TAKE 1 TABLET BY MOUTH ONCE DAILY FOR SUPPLEMENT 04/12/15  Yes Megan P Johnson, DO  traZODone (DESYREL) 50 MG tablet  TAKE 1 TABLET BY MOUTH ONCE DAILY AT BEDTIME FOR SLEEP 03/13/15  Yes Megan P Johnson, DO      PHYSICAL EXAMINATION:   VITAL SIGNS: Blood pressure 149/90, pulse 70, temperature 97.8 F (36.6 C), temperature source Oral, resp. rate 23, SpO2 100 %.  GENERAL:  82 y.o.-year-old patient lying in the bed with no acute distress. Edentulous, able to respond to voice stimuli, able to speak some, answers some questions appropriately but mostly" I don't know' EYES: Pupils equal, round, reactive to light and accommodation. No scleral icterus. Extraocular muscles intact.  HEENT: Head atraumatic, normocephalic. Oropharynx and nasopharynx clear.  NECK:  Supple, no jugular venous distention. No thyroid enlargement, no tenderness.  LUNGS: Normal breath sounds bilaterally, no wheezing, rales,rhonchi or crepitation. No use of accessory muscles of respiration.  CARDIOVASCULAR: S1, S2 , irregularly irregular. 2/6 systolic murmur was heard anteriorly, no rubs, or gallops.  ABDOMEN: Soft, nontender, nondistended. Bowel sounds present. No organomegaly or mass.  EXTREMITIES: No pedal edema, cyanosis, or clubbing.  NEUROLOGIC: Cranial nerves II through XII are grossly intact. Muscle strength difficult to evaluate since patient is very poorly cooperative, I'm not able to elicit any significant weakness in upper extremities, although it could be that her right upper extremity slightly weaker, although evaluation is extremely compromised, left lower extremity is weaker than right lower extremity 4/5. Sensation grossly intact. Gait not checked.  PSYCHIATRIC: The patient is alert and oriented x 2, poorly cooperative. Memory is impaired.  SKIN: No obvious rash, lesion, or ulcer.   LABORATORY PANEL:   CBC  Recent Labs Lab 08/19/15 0742  WBC 8.0  HGB 12.1  HCT 35.9  PLT 191  MCV 86.5  MCH 29.1  MCHC 33.7  RDW 15.0*    ------------------------------------------------------------------------------------------------------------------  Chemistries   Recent Labs Lab 08/19/15 0742  NA 139  K 4.4  CL 102  CO2 30  GLUCOSE 110*  BUN 13  CREATININE 0.67  CALCIUM 9.0   ------------------------------------------------------------------------------------------------------------------  Cardiac Enzymes  Recent Labs Lab 08/19/15 0742  TROPONINI <0.03   ------------------------------------------------------------------------------------------------------------------  RADIOLOGY: Ct Head Wo Contrast  08/19/2015  CLINICAL DATA:  Unresponsive EXAM: CT HEAD WITHOUT CONTRAST TECHNIQUE: Contiguous axial images were obtained from the base of the skull through the vertex without intravenous contrast. COMPARISON:  01/11/2015 FINDINGS: The bony calvarium is intact. Diffuse atrophic changes and chronic white matter ischemic changes are seen. Additionally there is some decreased attenuation consistent with subacute infarct in the right parietal lobe. No hemorrhage, acute infarct or space-occupying mass lesion is identified. IMPRESSION: Chronic atrophic and ischemic changes. Changes consistent with subacute infarct in the right parietal lobe when compared with the prior exam. These results were called by telephone at the time of interpretation on 08/19/2015 at 8:37 am to Dr. Governor RooksEBECCA LORD , who verbally  acknowledged these results. Electronically Signed   By: Alcide Clever M.D.   On: 08/19/2015 08:37    EKG: Orders placed or performed during the hospital encounter of 08/19/15  . ED EKG  . ED EKG   Atrial fibrillation is rate of 70, normal axis, left anterior fascicular block, left ventricular hypertrophy, minimal ST elevation in anteriorlateral leads. No acute ST-T changes  IMPRESSION AND PLAN:  Principal Problem:   CVA (cerebral infarction) Active Problems:   Right sided weakness   Seizure secondary to subtherapeutic  anticonvulsant medication (HCC)   Hyperglycemia #1. Stroke, likely recurrent, patient was seen to have a right parietal lobe stroke on CT scan, subacute, she also likely had TIA or mild stroke in the left side with right-sided weakness, seen by paramedics. Admit patient to medical floor, continue her on aspirin, neurologist involved, get MRI of the brain, carotid ultrasound, echo #2. Suspected seizure with subtherapeutic Depakote level, advance Depakote to 250 twice daily, follow level closely #3. Hyperglycemia, get hemoglobin A1c #4. Dementia, continue supportive therapy, home medications #5. Malignant essential hypertension, continue losartan, keeping patient's systolic blood pressure is around 160-180 #6. A. fib, rate controlled, not on anticoagulation due to concerns of fall, follow-up with neurologist recommendations  All the records are reviewed and case discussed with ED provider. Management plans discussed with the patient, family and they are in agreement.  CODE STATUS: Code Status History    Date Active Date Inactive Code Status Order ID Comments User Context   10/15/2014 11:27 PM 10/17/2014  7:10 PM Full Code [id number]  Wyatt Haste, MD ED    Advance Directive Documentation        Most Recent Value   Type of Advance Directive  Out of facility DNR (pink MOST or yellow form)   Pre-existing out of facility DNR order (yellow form or pink MOST form)     "MOST" Form in Place?         TOTAL TIME TAKING CARE OF THIS PATIENT: 50 minutes.    Katharina Caper M.D on 08/19/2015 at 11:25 AM  Between 7am to 6pm - Pager - [phone] After 6pm go to [email or web address] - password [email or web address] [email or web address]  [facility]  Office  [phone]  CC: Primary care physician; Olevia Perches, DO

## 2015-08-19 NOTE — Progress Notes (Signed)
Patient in bed, very lethargic. Family at bedside. VSS. Patient takes meds whole with applesauce. Will continue to monitor Mary [name]

## 2015-08-20 ENCOUNTER — Inpatient Hospital Stay: Payer: Medicare Other

## 2015-08-20 ENCOUNTER — Ambulatory Visit: Payer: Medicare Other | Admitting: Family Medicine

## 2015-08-20 DIAGNOSIS — Z79899 Other long term (current) drug therapy: Secondary | ICD-10-CM

## 2015-08-20 DIAGNOSIS — R569 Unspecified convulsions: Secondary | ICD-10-CM

## 2015-08-20 DIAGNOSIS — M6289 Other specified disorders of muscle: Secondary | ICD-10-CM

## 2015-08-20 DIAGNOSIS — I639 Cerebral infarction, unspecified: Principal | ICD-10-CM

## 2015-08-20 LAB — CBC
HCT: 32.4 % — ABNORMAL LOW (ref 35.0–47.0)
Hemoglobin: 11 g/dL — ABNORMAL LOW (ref 12.0–16.0)
MCH: 29.2 pg (ref 26.0–34.0)
MCHC: 33.9 g/dL (ref 32.0–36.0)
MCV: 86.2 fL (ref 80.0–100.0)
PLATELETS: 179 10*3/uL (ref 150–440)
RBC: 3.75 MIL/uL — ABNORMAL LOW (ref 3.80–5.20)
RDW: 15 % — ABNORMAL HIGH (ref 11.5–14.5)
WBC: 5.8 10*3/uL (ref 3.6–11.0)

## 2015-08-20 LAB — BASIC METABOLIC PANEL
Anion gap: 3 — ABNORMAL LOW (ref 5–15)
BUN: 18 mg/dL (ref 6–20)
CALCIUM: 8.7 mg/dL — AB (ref 8.9–10.3)
CO2: 31 mmol/L (ref 22–32)
CREATININE: 0.66 mg/dL (ref 0.44–1.00)
Chloride: 105 mmol/L (ref 101–111)
GFR calc Af Amer: >60 mL/min (ref >60)
GFR calc non Af Amer: >60 mL/min (ref >60)
GLUCOSE: 82 mg/dL (ref 65–99)
Potassium: 4 mmol/L (ref 3.5–5.1)
Sodium: 139 mmol/L (ref 135–145)

## 2015-08-20 LAB — PROLACTIN: Prolactin: 73.4 ng/mL — ABNORMAL HIGH (ref 4.8–23.3)

## 2015-08-20 LAB — VALPROIC ACID LEVEL: VALPROIC ACID LVL: 35 ug/mL — AB (ref 50.0–100.0)

## 2015-08-20 MED ORDER — CLOPIDOGREL BISULFATE 75 MG PO TABS: 75.0000 mg | ORAL_TABLET | Freq: Every day | ORAL | Status: AC

## 2015-08-20 MED ORDER — ENOXAPARIN SODIUM 40 MG/0.4ML ~~LOC~~ SOLN
40.0000 mg | SUBCUTANEOUS | Status: DC
  Administered 2015-08-20 – 2015-08-22 (×3): 40 mg via SUBCUTANEOUS
  Filled 2015-08-20 (×3): qty 0.4

## 2015-08-20 MED ORDER — CLOPIDOGREL BISULFATE 75 MG PO TABS
75.0000 mg | ORAL_TABLET | Freq: Every day | ORAL | Status: DC
  Administered 2015-08-20 – 2015-08-22 (×3): 75 mg via ORAL
  Filled 2015-08-20 (×3): qty 1

## 2015-08-20 NOTE — Care Management (Signed)
CVA. PT, PT pending. Will likely need SNF. Following with CSW

## 2015-08-20 NOTE — Discharge Instructions (Addendum)
°  DIET:  Regular diet Pureed food with thin liquids.  DISCHARGE CONDITION:  Stable  ACTIVITY:  Activity as tolerated  OXYGEN:  Home Oxygen: No.   Oxygen Delivery: room air  DISCHARGE LOCATION:  nursing home   If you experience worsening of your admission symptoms, develop shortness of breath, life threatening emergency, suicidal or homicidal thoughts you must seek medical attention immediately by calling 911 or calling your MD immediately  if symptoms less severe.  You Must read complete instructions/literature along with all the possible adverse reactions/side effects for all the Medicines you take and that have been prescribed to you. Take any new Medicines after you have completely understood and accpet all the possible adverse reactions/side effects.   Please note  You were cared for by a hospitalist during your hospital stay. If you have any questions about your discharge medications or the care you received while you were in the hospital after you are discharged, you can call the unit and asked to speak with the hospitalist on call if the hospitalist that took care of you is not available. Once you are discharged, your primary care physician will handle any further medical issues. Please note that NO REFILLS for any discharge medications will be authorized once you are discharged, as it is imperative that you return to your primary care physician (or establish a relationship with a primary care physician if you do not have one) for your aftercare needs so that they can reassess your need for medications and monitor your lab values.

## 2015-08-20 NOTE — Progress Notes (Signed)
PT Cancellation Note  Patient Details Name: Mary [name] MRN: [id number] DOB: 10/23/1921   Cancelled Treatment:    Reason Eval/Treat Not Completed: Other (comment). Evaluation attempted, however pt very lethargic, unable to arouse to verbal/tactile cues. Sternal rubbing noted with minimal eye opening, however not appropriate for therapy intervention at this time. Will re-attempt next date.   Baer Hinton 08/20/2015, 10:45 AM  Elizabeth PalauStephanie [name], PT, DPT [phone]

## 2015-08-20 NOTE — Evaluation (Signed)
Occupational Therapy Evaluation Patient Details Name: Mary GentileMildred [name] MRN: [id number] DOB: 06/11/1921 Today's Date: 08/20/2015    History of Present Illness Pt. is a 82 y.o. female with a known history of Hypertension, Alzheimer's disease, seizure disorder, for which patient is on Depakote who presents to the hospital with complaints of unresponsiveness earlier today. When the patient's family and caregiver. Patient was woken up at around 5:30 AM she was showered cleaned and then taken to the hall to sit down when she was seen later in the morning at around 7 AM. She was noted to be slumped and frothing around her mouth, drooling, she was noted not to respond well. EMS was called and patient was noted to have right-sided weakness, at that time, she was not able to speak   Clinical Impression   Pt. Is a 82 y.o. female who was admitted with A CVA. Pt. Has a history of Alzheimer's Disease. Pt. Presents with limited cognition, lethargy, impaired ability to follow commands, and weakness. Pt. Could benefit from skilled OT services for ADL training, UE neuro-muscular re-ed, positioning, and pt./family Education. Pt. Was lethargic with occasional eye opening in response to verbal stimuli. Pt. With poor initiation of command following. Pt. Family ed. was provided about pt. Current functional status, light grooming/oral care skills, self-feeding, and positioning.     Follow Up Recommendations  SNF    Equipment Recommendations       Recommendations for Other Services PT consult     Precautions / Restrictions Restrictions Weight Bearing Restrictions: No      Mobility Bed Mobility                  Transfers                      Balance                                            ADL Overall ADL's : Needs assistance/impaired Eating/Feeding: Total assistance   Grooming: Total assistance           Upper Body Dressing : Total assistance   Lower Body  Dressing: Total assistance               Functional mobility during ADLs: Total assistance       Vision     Perception     Praxis      Pertinent Vitals/Pain Pain Assessment:  (Pt. unable to specify pain.)     Hand Dominance Right   Extremity/Trunk Assessment Upper Extremity Assessment Upper Extremity Assessment: Difficult to assess due to impaired cognition           Communication     Cognition Arousal/Alertness: Lethargic Behavior During Therapy:  (Lethargic, occassionally opens eyes. Does not follow commands.) Overall Cognitive Status: History of cognitive impairments - at baseline                     General Comments       Exercises       Shoulder Instructions      Home Living Family/patient expects to be discharged to:: Skilled nursing facility                                        Prior  Functioning/Environment Level of Independence: Needs assistance  Gait / Transfers Assistance Needed: Pt. daughter reports her MD recommended a walker, however pt. never used it. ADL's / Homemaking Assistance Needed: Assist was required for daily [facility]. Pt. was able to feed self.        OT Diagnosis: Generalized weakness   OT Problem List: Decreased strength;Decreased range of motion;Decreased activity tolerance;Decreased safety awareness;Decreased coordination;Impaired UE functional use;Decreased cognition   OT Treatment/Interventions: Self-care/ADL training;Therapeutic exercise;DME and/or AE instruction;Therapeutic activities;Patient/family education;Neuromuscular education    OT Goals(Current goals can be found in the care plan section) Acute Rehab OT Goals OT Goal Formulation: Patient unable to participate in goal setting  OT Frequency: Min 2X/week   Barriers to D/C:            Co-evaluation              End of Session    Activity Tolerance: Patient limited by lethargy (Limited by  cognition.) Patient left: in bed;with call bell/phone within reach;with bed alarm set;with family/visitor present   Time: 0950-1015 OT Time Calculation (min): 25 min Charges:  OT General Charges $OT Visit: 1 Procedure OT Evaluation $OT Eval High Complexity: 1 Procedure G-Codes:     Olegario MessierElaine Barney Russomanno, MS, OTR/L Olegario MessierElaine Joseandres Mazer 08/20/2015, 10:40 AM

## 2015-08-20 NOTE — Progress Notes (Addendum)
Neuro assessments have been difficult to obtain today due to patient's drowsiness. Patient will respond to painful stimuli, but has not opened her eyes much and has not spoken to staff or family since this morning. This AM patient was very alert and spoke clearly "good morning". Around 1700 upon assessment was able to get patient awake and she was able to say her name, but her speech was very slurred and she had some saliva sitting in her mouth. Notified Dr. Elpidio AnisSudini and MD ordered a stat CT. Will continue to monitor.

## 2015-08-20 NOTE — NC FL2 (Signed)
[place] MEDICAID FL2 LEVEL OF CARE SCREENING TOOL     IDENTIFICATION  Patient Name: Mary GentileMildred [name] Birthdate: 11/27/1921 Sex: female Admission Date (Current Location): 08/19/2015  [place]   Facility and Address:  [facility], [address], [place], [place] [address]      Provider Number: [id number]  Attending Physician Name and Address:  Milagros LollSrikar Sudini, MD  Relative Name and Phone Number:       Current Level of Care: Hospital Recommended Level of Care: Skilled Nursing Facility Prior Approval Number:    Date Approved/Denied:   PASRR Number:  [id number])  Discharge Plan: SNF    Current Diagnoses: Patient Active Problem List   Diagnosis Date Noted  . CVA (cerebral infarction) 08/19/2015  . Right sided weakness 08/19/2015  . Seizure secondary to subtherapeutic anticonvulsant medication (HCC) 08/19/2015  . Hyperglycemia 08/19/2015  . Abnormal EKG 04/09/2015  . Malnutrition (HCC) 01/23/2015  . Seizures (HCC) 10/17/2014  . Elevated LFTs 08/16/2014  . Dementia 08/15/2014  . Insomnia 08/15/2014  . Benign hypertensive renal disease 08/15/2014    Orientation RESPIRATION BLADDER Height & Weight      (Disoriented )  Normal Continent Weight: 103 lb 12.8 oz (47.083 kg) Height:  5\' 1"  (154.9 cm)  BEHAVIORAL SYMPTOMS/MOOD NEUROLOGICAL BOWEL NUTRITION STATUS   (None) Convulsions/Seizures (Seizure (HCC)) Incontinent Diet (DYS 1)  AMBULATORY STATUS COMMUNICATION OF NEEDS Skin   Extensive Assist Verbally Normal                       Personal Care Assistance Level of Assistance  Bathing, Feeding, Dressing Bathing Assistance: Limited assistance Feeding assistance: Independent Dressing Assistance: Limited assistance     Functional Limitations Info  Sight, Hearing, Speech Sight Info: Adequate Hearing Info: Adequate Speech Info: Adequate    SPECIAL CARE FACTORS FREQUENCY  PT (By licensed PT), OT (By licensed  OT)     PT Frequency:  (5) OT Frequency:  (5)            Contractures      Additional Factors Info  Code Status, Allergies Code Status Info:  (DNR) Allergies Info:  (No Known Allergies )           Current Medications (08/20/2015):  This is the current hospital active medication list Current Facility-Administered Medications  Medication Dose Route Frequency Provider Last Rate Last Dose  . acetaminophen (TYLENOL) tablet 325-650 mg  325-650 mg Oral Q6H PRN Katharina Caperima Vaickute, MD      . alum & mag hydroxide-simeth (MAALOX/MYLANTA) 200-200-20 MG/5ML suspension 30 mL  30 mL Oral PRN Katharina Caperima Vaickute, MD      . bismuth subsalicylate (PEPTO BISMOL) 262 MG/15ML suspension 30 mL  30 mL Oral Q6H PRN Katharina Caperima Vaickute, MD      . clopidogrel (PLAVIX) tablet 75 mg  75 mg Oral Daily Thana FarrLeslie Reynolds, MD      . divalproex (DEPAKOTE SPRINKLE) capsule 250 mg  250 mg Oral BID Katharina Caperima Vaickute, MD   250 mg at 08/20/15 1037  . docusate sodium (COLACE) capsule 100 mg  100 mg Oral BID Katharina Caperima Vaickute, MD   100 mg at 08/20/15 1038  . donepezil (ARICEPT) tablet 10 mg  10 mg Oral QHS Katharina Caperima Vaickute, MD   10 mg at 08/19/15 2132  . enoxaparin (LOVENOX) injection 40 mg  40 mg Subcutaneous Q24H Sheema M Hallaji, RPH      . latanoprost (XALATAN) 0.005 % ophthalmic solution 1 drop  1 drop  Both Eyes QHS Katharina Caperima Vaickute, MD   1 drop at 08/19/15 2134  . loperamide (IMODIUM A-D) tablet 2 mg  2 mg Oral QID PRN Katharina Caperima Vaickute, MD      . losartan (COZAAR) tablet 50 mg  50 mg Oral Daily Katharina Caperima Vaickute, MD   50 mg at 08/20/15 1037  . magnesium hydroxide (MILK OF MAGNESIA) suspension 30 mL  30 mL Oral Daily PRN Katharina Caperima Vaickute, MD      . ondansetron (ZOFRAN) tablet 4 mg  4 mg Oral Q6H PRN Katharina Caperima Vaickute, MD       Or  . ondansetron (ZOFRAN) injection 4 mg  4 mg Intravenous Q6H PRN Katharina Caperima Vaickute, MD      . traZODone (DESYREL) tablet 50 mg  50 mg Oral QHS Katharina Caperima Vaickute, MD   50 mg at 08/19/15 2132     Discharge Medications: Please see discharge  summary for a list of discharge medications.  Relevant Imaging Results:  Relevant Lab Results:   Additional Information  (SSN [id number])  Mary Ellenhristina E Khole Arterburn, LCSW

## 2015-08-20 NOTE — Care Management Important Message (Signed)
Important Message  Patient Details  Name: Mary [name] MRN: [id number] Date of Birth: 02/25/1921   Medicare Important Message Given:  Yes    [name], RN 08/20/2015, 2:28 PM

## 2015-08-20 NOTE — Progress Notes (Signed)
[facility] -  at [facility]   PATIENT NAME: Mary GentileMildred [name]    MR#:  [id number]  DATE OF BIRTH:  03/18/1921  SUBJECTIVE:  CHIEF COMPLAINT:   Chief Complaint  Patient presents with  . Loss of Consciousness   Drowsy today. Was able to tolerate dysphagia diet. Family at bedside.  REVIEW OF SYSTEMS:    Review of Systems  Unable to perform ROS: dementia    DRUG ALLERGIES:  No Known Allergies  VITALS:  Blood pressure 157/59, pulse 57, temperature 98.7 F (37.1 C), temperature source Oral, resp. rate 18, height 5\' 1"  (1.549 m), weight 47.083 kg (103 lb 12.8 oz), SpO2 100 %.  PHYSICAL EXAMINATION:   Physical Exam  GENERAL:  82 y.o.-year-old patient lying in the bed with drowzy EYES: Pupils equal, round, reactive to light and accommodation. No scleral icterus. Extraocular muscles intact.  HEENT: Head atraumatic, normocephalic. Oropharynx and nasopharynx clear.  NECK:  Supple, no jugular venous distention. No thyroid enlargement, no tenderness.  LUNGS: Normal breath sounds bilaterally, no wheezing, rales, rhonchi. No use of accessory muscles of respiration.  CARDIOVASCULAR: S1, S2 normal. No murmurs, rubs, or gallops.  ABDOMEN: Soft, nontender, nondistended. Bowel sounds present. No organomegaly or mass.  EXTREMITIES: No cyanosis, clubbing or edema b/l.    NEUROLOGIC: Cranial nerves II through XII are intact. Follows commands Motor strength decreased on right compared to left PSYCHIATRIC: The patient is drowzy SKIN: No obvious rash, lesion, or ulcer.   LABORATORY PANEL:   CBC  Recent Labs Lab 08/20/15 0308  WBC 5.8  HGB 11.0*  HCT 32.4*  PLT 179   ------------------------------------------------------------------------------------------------------------------ Chemistries   Recent Labs Lab 08/20/15 0308  NA 139  K 4.0  CL 105  CO2 31  GLUCOSE 82  BUN 18  CREATININE 0.66  CALCIUM 8.7*    ------------------------------------------------------------------------------------------------------------------  Cardiac Enzymes  Recent Labs Lab 08/19/15 0742  TROPONINI <0.03   ------------------------------------------------------------------------------------------------------------------  RADIOLOGY:  Ct Head Wo Contrast  08/19/2015  CLINICAL DATA:  Unresponsive EXAM: CT HEAD WITHOUT CONTRAST TECHNIQUE: Contiguous axial images were obtained from the base of the skull through the vertex without intravenous contrast. COMPARISON:  01/11/2015 FINDINGS: The bony calvarium is intact. Diffuse atrophic changes and chronic white matter ischemic changes are seen. Additionally there is some decreased attenuation consistent with subacute infarct in the right parietal lobe. No hemorrhage, acute infarct or space-occupying mass lesion is identified. IMPRESSION: Chronic atrophic and ischemic changes. Changes consistent with subacute infarct in the right parietal lobe when compared with the prior exam. These results were called by telephone at the time of interpretation on 08/19/2015 at 8:37 am to Dr. Governor RooksEBECCA LORD , who verbally acknowledged these results. Electronically Signed   By: Alcide CleverMark  Lukens M.D.   On: 08/19/2015 08:37   Mr Brain Wo Contrast  08/19/2015  CLINICAL DATA:  82 year old hypertensive female with Alzheimer's disease and seizure disorder. Unresponsive episode. Subsequent encounter. EXAM: MRI HEAD WITHOUT CONTRAST TECHNIQUE: Multiplanar, multiecho pulse sequences of the brain and surrounding structures were obtained without intravenous contrast. COMPARISON:  08/19/2015 head CT.  10/16/2014 limited MR. FINDINGS: Acute nonhemorrhagic left paracentral pontine infarct. Remote posterior right frontal -parietal lobe infarct with encephalomalacia. Remote tiny bilateral cerebellar infarcts and left lenticular nucleus infarct. Marked confluent periventricular/subcortical white matter changes consistent  with result of small vessel disease. Global atrophy without hydrocephalus. Major intracranial vascular structures are patent. No intracranial mass lesion noted on this unenhanced exam. No evidence of mesial temporal sclerosis. Mild cervical spondylotic changes  C5-6 with minimal cord flattening. Minimal mucosal thickening ethmoid sinus air cells. Post lens replacement without acute orbital abnormality. IMPRESSION: Acute nonhemorrhagic left paracentral pontine infarct. Remote posterior right frontal -parietal lobe infarct with encephalomalacia. Remote tiny bilateral cerebellar infarcts and left lenticular nucleus infarct. Marked confluent periventricular/subcortical white matter changes consistent with result of small vessel disease. Global atrophy. Electronically Signed   By: Lacy DuverneySteven  Olson M.D.   On: 08/19/2015 16:52   [place] Carotid Bilateral  08/19/2015  CLINICAL DATA:  Stroke. History of hypertension, Alzheimer's disease, seizures. EXAM: BILATERAL CAROTID DUPLEX ULTRASOUND TECHNIQUE: Wallace CullensGray scale imaging, color Doppler and duplex ultrasound were performed of bilateral carotid and vertebral arteries in the neck. COMPARISON:  MRI of the head August 19, 2015 at 1625 hours FINDINGS: Criteria: Quantification of carotid stenosis is based on velocity parameters that correlate the residual internal carotid diameter with NASCET-based stenosis levels, using the diameter of the distal internal carotid lumen as the denominator for stenosis measurement. The following velocity measurements were obtained: RIGHT ICA:  39/10 cm/sec CCA:  92/14 cm/sec SYSTOLIC ICA/CCA RATIO:  0.4 DIASTOLIC ICA/CCA RATIO:  0.8 ECA:  59 cm/sec LEFT ICA:  46/13 cm/sec CCA:  76/12 cm/sec SYSTOLIC ICA/CCA RATIO:  0.6 DIASTOLIC ICA/CCA RATIO:  1.1 ECA:  57 cm/sec RIGHT CAROTID ARTERY: Common carotid artery is widely patent with normal color flow and spectral wave words. Minimal plaque internal carotid bulb, which appears widely patent with normal color flow and  spectral waveforms. RIGHT VERTEBRAL ARTERY: Patent with normal antegrade spectral waveforms. LEFT CAROTID ARTERY: Common carotid artery is widely patent with normal color flow and spectral wave words. Minimal plaque internal carotid bulb, which appears widely patent with normal color flow and spectral waveforms. LEFT VERTEBRAL ARTERY: Patent with normal antegrade spectral waveforms. Irregular heart beat noted on all spectral waveforms. IMPRESSION: No hemodynamically significant stenosis. Irregular heart beat. Electronically Signed   By: Awilda Metroourtnay  Bloomer M.D.   On: 08/19/2015 17:57     ASSESSMENT AND PLAN:   # Acute left pontine CVA Plavix, Statin Physical therapy, occupational therapy, speech consultation. Echocardiogram and carotid Doppler showed nothing acute Likely need skilled nursing facility. Lovenox for DVT prophylaxis  #. Suspected seizure with subtherapeutic Depakote level, advance Depakote to 250 twice daily, follow level closely  #3. Hyperglycemia HbA1c is 5.9  #4. Dementia, continue supportive therapy, home medication s #5. Essential hypertension  #6. A. fib, rate controlled, not on anticoagulation due to concerns of fall  All the records are reviewed and case discussed with Care Management/Social Workerr. Management plans discussed with the patient, family and they are in agreement.  CODE STATUS: DNR  DVT Prophylaxis: SCDs  TOTAL TIME TAKING CARE OF THIS PATIENT: 35 minutes.   POSSIBLE D/C IN 2-3 DAYS, DEPENDING ON CLINICAL CONDITION.  Milagros LollSudini, Takeru Bose R M.D on 08/20/2015 at 1:35 PM  Between 7am to 6pm - Pager - [phone]  After 6pm go to [email or web address] - password [email or web address]  [facility]  Office  [phone]  CC: Primary care physician; Olevia PerchesMegan Johnson, DO  Note: This dictation was prepared with Dragon dictation along with smaller phrase technology. Any transcriptional errors that result from this process are unintentional.

## 2015-08-20 NOTE — Progress Notes (Signed)
Patient sleeping. No signs or symptoms of discomfort. Mary [name],Mary [name]

## 2015-08-20 NOTE — Consult Note (Signed)
   [facility] CM [facility] Consult   08/20/2015  Celesta GentileMildred Jowers 12/19/1921 [id number]   Patient screened for potential [facility] Care Management services. Patient is eligible for [facility] Care [facility] Services. Electronic medical record reveals patient's discharge plan is SNF and there were no identifiable [facility] care management needs at this time. [facility] Care Management services not appropriate at this time. If patient's post hospital needs change please place a [facility] Care Management consult. For questions please contact:   Solash Tullo RN, BSN [facility]  Hospital Liaison  [phone]) Business Mobile [phone]) Toll free office

## 2015-08-20 NOTE — Clinical Social Work Note (Signed)
Clinical Social Work Assessment  Patient Details  Name: Celesta GentileMildred Kemple MRN: [id number] Date of Birth: 07/03/1921  Date of referral:  08/20/15               Reason for consult:  Facility Placement                Permission sought to share information with:  Family Supports Permission granted to share information::     Name::        Agency::     Relationship::     Contact Information:     Housing/Transportation Living arrangements for the past 2 months:  Group [facility] ([facility]) Source of Information:  Adult Children Patient Interpreter Needed:  None Criminal Activity/Legal Involvement Pertinent to Current Situation/Hospitalization:  No - Comment as needed Significant Relationships:  Adult Children Lives with:  Facility Resident Do you feel safe going back to the place where you live?  No (Needs a higher level of care) Need for family participation in patient care:  Yes (Comment) (Pt's daughter will help coordinate pt's care)  Care giving concerns: Pt to d/c to SNF. No other care giving concerns have been identified at this time.  Social Worker assessment / plan:  CSW engaged with pt's daughter, Eather ColasDelores, at pt's bedside. Pt and [facility] worker were both present at time of interaction. CSW introduced herself and her role as a Child psychotherapistsocial worker. CSW informed Delores that pt has been recommended for SNF placement and explained what SNF was and how Medicare would work in regards to the placement. Pt's daughter asked questions about her options and then informed CSW that she is agreeable to SNF placement. Pt's daughter states that she would prefer for pt to [facility] to either [facility], [facility], [facility], or [facility]. FL-2 and PASRR complete and placed in MD basket for cosign. CSW referral sent for SNF placement. Awaiting bed offers. CSW will continue to follow and assist.  Employment status:  Disabled (Comment on whether or not currently receiving Disability) Insurance information:   Medicare PT Recommendations:  Skilled Nursing Facility Information / Referral to community resources:  Skilled Nursing Facility  Patient/Family's Response to care:  Pt's family is appreciative of the care that the pt has received.   Patient/Family's Understanding of and Emotional Response to Diagnosis, Current Treatment, and Prognosis:  Pt's family demonstrates a clear understanding of pt's diagnosis and prognosis.   Emotional Assessment Appearance:  Appears stated age Attitude/Demeanor/Rapport:   (Pt was asleep during assessment. Information obtained from daughter.) Affect (typically observed):  Calm, Quiet Orientation:  Fluctuating Orientation (Suspected and/or reported Sundowners) Alcohol / Substance use:  Not Applicable Psych involvement (Current and /or in the community):  No (Comment)  Discharge Needs  Concerns to be addressed:  No discharge needs identified Readmission within the last 30 days:  No Current discharge risk:  None Barriers to Discharge:  No Barriers Identified   Jonathon JordanLynn B Bernardette Waldron, LCSWA 08/20/2015, 3:20 PM

## 2015-08-20 NOTE — Consult Note (Signed)
Referring Physician: Sudini    Chief Complaint: Difficulty with gait  HPI: Mary [name] is an 82 y.o. female who due to her Alzheimer's is unable to provide any history.  All history obtained from the chart.  Patient is a facility resident.  Presented on 7/2 after being found unresponsive.  Patient was woken up at around 5:30 AM when she was showered, cleaned and then taken to the hall to sit down.  Later in the morning at around 7 AM she was noted to be slumped and frothing around her mouth, drooling, she was noted not to respond well. EMS was called and patient was noted to have right-sided weakness, at that time, she was not able to speak. Patient was brought in for evaluation.  Initial NIHSS of 10.   It seems that at baseline patient refuses to use a device for help with ambulation and is therefore a fall risk.    Date last known well: 08/19/2015 Time last known well: Time: 05:30 tPA Given: No: Outside time window  MRankin: 3  Past Medical History  Diagnosis Date  . Arthritis   . Hypertension   . Kidney disease   . Frequent falls   . Alzheimer disease   . Seizure [facility](HCC)     Past Surgical History  Procedure Laterality Date  . Tubal ligation      Family History  Problem Relation Age of Onset  . Hypertension Mother   . Hypertension Father   . Stroke Mother   . Stroke Sister   . Heart attack Father   . Heart disease Mother   . Diabetes Sister    Social History:  reports that she has never smoked. She has never used smokeless tobacco. She reports that she does not drink alcohol or use illicit drugs.  Allergies: No Known Allergies  Medications:  I have reviewed the patient's current medications. Prior to Admission:  Prescriptions prior to admission  Medication Sig Dispense Refill Last Dose  . acetaminophen (TYLENOL) 325 MG tablet Take 325-650 mg by mouth every 6 (six) hours as needed for mild pain or headache.    unknown  . alum & mag hydroxide-simeth (MAALOX/MYLANTA)  200-200-20 MG/5ML suspension Take 30 mLs by mouth as needed for indigestion or heartburn.    unknown  . ASPIR-LOW 81 MG EC tablet TAKE 1 TABLET BY MOUTH ONCE DAILY FOR CIRCULATION 30 tablet 12 08/18/2015 at Unknown time  . bismuth subsalicylate (PEPTO BISMOL) 262 MG/15ML suspension Take 30 mLs by mouth every 6 (six) hours as needed (for GI upset).   unknown  . Cranberry Fruit 405 MG CAPS TAKE 1 CAPSULE BY MOUTH TWICE DAILY TO PREVENT UTI 60 each 12 08/18/2015 at Unknown time  . divalproex (DEPAKOTE SPRINKLE) 125 MG capsule Take 125 mg by mouth 2 (two) times daily.   08/18/2015 at Unknown time  . donepezil (ARICEPT) 10 MG tablet TAKE 1 TABLET BY MOUTH ONCE DAILY FOR MEMORY 30 tablet 6 08/18/2015 at Unknown time  . GNP VITAMIN D SUPER STRENGTH 5000 units TABS TAKE 1 TABLET BY MOUTH ONCE DAILY FOR SUPPLEMENT 30 each 12 08/18/2015 at Unknown time  . latanoprost (XALATAN) 0.005 % ophthalmic solution Place 1 drop into both eyes at bedtime.   08/18/2015 at Unknown time  . loperamide (IMODIUM A-D) 2 MG tablet Take 2 mg by mouth 4 (four) times daily as needed for diarrhea or loose stools.   unknown  . magnesium hydroxide (MILK OF MAGNESIA) 400 MG/5ML suspension Take 30 mLs by mouth  daily as needed for mild constipation.    unknown  . Multiple Vitamin (TAB-A-VITE) TABS TAKE 1 TABLET BY MOUTH ONCE DAILY FOR SUPPLEMENT 30 each 12 08/18/2015 at Unknown time  . traZODone (DESYREL) 50 MG tablet TAKE 1 TABLET BY MOUTH ONCE DAILY AT BEDTIME FOR SLEEP 30 tablet 6 08/18/2015 at Unknown time   Scheduled: . aspirin EC  325 mg Oral Daily  . divalproex  250 mg Oral BID  . docusate sodium  100 mg Oral BID  . donepezil  10 mg Oral QHS  . enoxaparin (LOVENOX) injection  30 mg Subcutaneous Q24H  . latanoprost  1 drop Both Eyes QHS  . losartan  50 mg Oral Daily  . traZODone  50 mg Oral QHS    ROS: History obtained from chart review  General ROS: negative for - chills, fatigue, fever, night sweats, weight gain or weight  loss Psychological ROS: negative for - behavioral disorder, hallucinations, memory difficulties, mood swings or suicidal ideation Ophthalmic ROS: negative for - blurry vision, double vision, eye pain or loss of vision ENT ROS: negative for - epistaxis, nasal discharge, oral lesions, sore throat, tinnitus or vertigo Allergy and Immunology ROS: negative for - hives or itchy/watery eyes Hematological and Lymphatic ROS: negative for - bleeding problems, bruising or swollen lymph nodes Endocrine ROS: negative for - galactorrhea, hair pattern changes, polydipsia/polyuria or temperature intolerance Respiratory ROS: negative for - cough, hemoptysis, shortness of breath or wheezing Cardiovascular ROS: negative for - chest pain, dyspnea on exertion, edema or irregular heartbeat Gastrointestinal ROS: negative for - abdominal pain, diarrhea, hematemesis, nausea/vomiting or stool incontinence Genito-Urinary ROS: negative for - dysuria, hematuria, incontinence or urinary frequency/urgency Musculoskeletal ROS: negative for - joint swelling or muscular weakness Neurological ROS: as noted in HPI Dermatological ROS: negative for rash and skin lesion changes  Physical Examination: Blood pressure 157/45, pulse 66, temperature 98.6 F (37 C), temperature source Oral, resp. rate 20, SpO2 99 %.  HEENT-  Normocephalic, no lesions, without obvious abnormality.  Normal external eye and conjunctiva.  Normal TM's bilaterally.  Normal auditory canals and external ears. Normal external nose, mucus membranes and septum.  Normal pharynx. Cardiovascular- S1, S2 normal, pulses palpable throughout   Lungs- chest clear, no wheezing, rales, normal symmetric air entry Abdomen- soft, non-tender; bowel sounds normal; no masses,  no organomegaly Extremities- no edema Lymph-no adenopathy palpable Musculoskeletal-no joint tenderness, deformity or swelling Skin-warm and dry, no hyperpigmentation, vitiligo, or suspicious  lesions  Neurological Examination Mental Status: Lethargic.  Opens eyes with deep sternal rub.  No speech.  Follows simple commands.  Unable to follow 3 step commands. Cranial Nerves: II: Discs flat bilaterally; Blinks to bilateral confrontation, pupils equal, round, reactive to light and accommodation III,IV, VI: right ptosis, extra-ocular motions intact bilaterally V,VII: right facial droop VIII: hearing normal bilaterally IX,X: gag reflex present XI: unable to test XII: unable to test Motor: Patient will not cooperate for formal testing.  Some spontaneous movement noted on the left.  Patient flaccid on the right.   Sensory: Does not respond to noxious stimuli on the right.  Grimaces to painful stimuli on the left.   Deep Tendon Reflexes: 2+ in the UE's, 1+ at the knees and absent at the ankles.   Plantars: Right: mute   Left: mute Cerebellar: Unable to perform secondary to ability to follow commands.   Gait: not tested due to safety concerns   Laboratory Studies:  Basic Metabolic Panel:  Recent Labs Lab 08/19/15 0742 08/20/15 0308  NA 139 139  K 4.4 4.0  CL 102 105  CO2 30 31  GLUCOSE 110* 82  BUN 13 18  CREATININE 0.67 0.66  CALCIUM 9.0 8.7*    Liver Function Tests: No results for input(s): AST, ALT, ALKPHOS, BILITOT, PROT, ALBUMIN in the last 168 hours. No results for input(s): LIPASE, AMYLASE in the last 168 hours. No results for input(s): AMMONIA in the last 168 hours.  CBC:  Recent Labs Lab 08/19/15 0742 08/20/15 0308  WBC 8.0 5.8  HGB 12.1 11.0*  HCT 35.9 32.4*  MCV 86.5 86.2  PLT 191 179    Cardiac Enzymes:  Recent Labs Lab 08/19/15 0742  TROPONINI <0.03    BNP: Invalid input(s): POCBNP  CBG: No results for input(s): GLUCAP in the last 168 hours.  Microbiology: Results for orders placed or performed during the hospital encounter of 08/19/15  MRSA PCR Screening     Status: None   Collection Time: 08/19/15  1:05 PM  Result Value Ref  Range Status   MRSA by PCR NEGATIVE NEGATIVE Final    Comment:        The GeneXpert MRSA Assay (FDA approved for NASAL specimens only), is one component of a comprehensive MRSA colonization surveillance program. It is not intended to diagnose MRSA infection nor to guide or monitor treatment for MRSA infections.     Coagulation Studies: No results for input(s): LABPROT, INR in the last 72 hours.  Urinalysis:  Recent Labs Lab 08/19/15 0742  COLORURINE YELLOW*  LABSPEC 1.014  PHURINE 7.0  GLUCOSEU NEGATIVE  HGBUR NEGATIVE  BILIRUBINUR NEGATIVE  KETONESUR NEGATIVE  PROTEINUR 30*  NITRITE NEGATIVE  LEUKOCYTESUR TRACE*    Lipid Panel:    Component Value Date/Time   CHOL 203* 05/21/2015 1028   TRIG 86 05/21/2015 1028   HDL 71 05/21/2015 1028   LDLCALC 115* 05/21/2015 1028    HgbA1C:  Lab Results  Component Value Date   HGBA1C 5.9 08/19/2015    Urine Drug Screen:  No results found for: LABOPIA, COCAINSCRNUR, LABBENZ, AMPHETMU, THCU, LABBARB  Alcohol Level: No results for input(s): ETH in the last 168 hours.   Imaging: Ct Head Wo Contrast  08/19/2015  CLINICAL DATA:  Unresponsive EXAM: CT HEAD WITHOUT CONTRAST TECHNIQUE: Contiguous axial images were obtained from the base of the skull through the vertex without intravenous contrast. COMPARISON:  01/11/2015 FINDINGS: The bony calvarium is intact. Diffuse atrophic changes and chronic white matter ischemic changes are seen. Additionally there is some decreased attenuation consistent with subacute infarct in the right parietal lobe. No hemorrhage, acute infarct or space-occupying mass lesion is identified. IMPRESSION: Chronic atrophic and ischemic changes. Changes consistent with subacute infarct in the right parietal lobe when compared with the prior exam. These results were called by telephone at the time of interpretation on 08/19/2015 at 8:37 am to Dr. Governor RooksEBECCA LORD , who verbally acknowledged these results. Electronically  Signed   By: Alcide CleverMark  Lukens M.D.   On: 08/19/2015 08:37   Mr Brain Wo Contrast  08/19/2015  CLINICAL DATA:  82 year old hypertensive female with Alzheimer's disease and seizure disorder. Unresponsive episode. Subsequent encounter. EXAM: MRI HEAD WITHOUT CONTRAST TECHNIQUE: Multiplanar, multiecho pulse sequences of the brain and surrounding structures were obtained without intravenous contrast. COMPARISON:  08/19/2015 head CT.  10/16/2014 limited MR. FINDINGS: Acute nonhemorrhagic left paracentral pontine infarct. Remote posterior right frontal -parietal lobe infarct with encephalomalacia. Remote tiny bilateral cerebellar infarcts and left lenticular nucleus infarct. Marked confluent periventricular/subcortical white matter changes consistent  with result of small vessel disease. Global atrophy without hydrocephalus. Major intracranial vascular structures are patent. No intracranial mass lesion noted on this unenhanced exam. No evidence of mesial temporal sclerosis. Mild cervical spondylotic changes C5-6 with minimal cord flattening. Minimal mucosal thickening ethmoid sinus air cells. Post lens replacement without acute orbital abnormality. IMPRESSION: Acute nonhemorrhagic left paracentral pontine infarct. Remote posterior right frontal -parietal lobe infarct with encephalomalacia. Remote tiny bilateral cerebellar infarcts and left lenticular nucleus infarct. Marked confluent periventricular/subcortical white matter changes consistent with result of small vessel disease. Global atrophy. Electronically Signed   By: Lacy Duverney M.D.   On: 08/19/2015 16:52   US Carotid Bilateral  08/19/2015  CLINICAL DATA:  Stroke. History of hypertension, Alzheimer's disease, seizures. EXAM: BILATERAL CAROTID DUPLEX ULTRASOUND TECHNIQUE: Wallace Cullens scale imaging, color Doppler and duplex ultrasound were performed of bilateral carotid and vertebral arteries in the neck. COMPARISON:  MRI of the head August 19, 2015 at 1625 hours FINDINGS:  Criteria: Quantification of carotid stenosis is based on velocity parameters that correlate the residual internal carotid diameter with NASCET-based stenosis levels, using the diameter of the distal internal carotid lumen as the denominator for stenosis measurement. The following velocity measurements were obtained: RIGHT ICA:  39/10 cm/sec CCA:  92/14 cm/sec SYSTOLIC ICA/CCA RATIO:  0.4 DIASTOLIC ICA/CCA RATIO:  0.8 ECA:  59 cm/sec LEFT ICA:  46/13 cm/sec CCA:  76/12 cm/sec SYSTOLIC ICA/CCA RATIO:  0.6 DIASTOLIC ICA/CCA RATIO:  1.1 ECA:  57 cm/sec RIGHT CAROTID ARTERY: Common carotid artery is widely patent with normal color flow and spectral wave words. Minimal plaque internal carotid bulb, which appears widely patent with normal color flow and spectral waveforms. RIGHT VERTEBRAL ARTERY: Patent with normal antegrade spectral waveforms. LEFT CAROTID ARTERY: Common carotid artery is widely patent with normal color flow and spectral wave words. Minimal plaque internal carotid bulb, which appears widely patent with normal color flow and spectral waveforms. LEFT VERTEBRAL ARTERY: Patent with normal antegrade spectral waveforms. Irregular heart beat noted on all spectral waveforms. IMPRESSION: No hemodynamically significant stenosis. Irregular heart beat. Electronically Signed   By: Awilda Metro M.D.   On: 08/19/2015 17:57    Assessment: 82 y.o. female presenting with right sided weakness and altered mental status.  Per nursing was more alert this morning.  Now lethargic.  Continued right sided weakness.  Initial head CT showed a right parietal lobe infarct that did not explain patient's presenting symptoms.  MRI of the brain personally reviewed and shows an acute left paracentral pontine infarct.  Likely secondary to small vessel disease.  Carotid dopplers show no evidence of hemodynamically significant stenosis.  Echocardiogram shows no cardiac source of emboli with an EF of 55-60%.  A1c 5.9. Concern is also  for seizure with episode of unresponsiveness.  Depakote level 16 on presentation.  Today level 35.    Stroke Risk Factors - hypertension  Plan: 1. Fasting lipid panel 2. PT consult, OT consult, Speech consult 3. Prophylactic therapy-Antiplatelet med: Plavix - dose 75mg  daily 4. NPO until RN stroke swallow screen 5. Telemetry monitoring 6. Frequent neuro checks 7.  Would not adjust Depakote level further at this time but recheck level in AM.   8.  EEG    Thana Farr, MD Neurology [phone] 08/20/2015, 11:09 AM

## 2015-08-20 NOTE — Evaluation (Signed)
Clinical/Bedside Swallow Evaluation Patient Details  Name: Mary GentileMildred [name] MRN: [id number] Date of Birth: 01/29/1922  Today's Date: 08/20/2015 Time: SLP Start Time (ACUTE ONLY): 0840 SLP Stop Time (ACUTE ONLY): 0925 SLP Time Calculation (min) (ACUTE ONLY): 45 min  Past Medical History:  Past Medical History  Diagnosis Date  . Arthritis   . Hypertension   . Kidney disease   . Frequent falls   . Alzheimer disease   . Seizure [facility](HCC)    Past Surgical History:  Past Surgical History  Procedure Laterality Date  . Tubal ligation     HPI:  Pt is a 82 y.o. female with a known history of Hypertension, Alzheimer's disease, seizure disorder, for which patient is on Depakote who presents to the hospital with complaints of unresponsiveness earlier today. When the patient's family and caregiver. Patient was woken up at around 5:30 AM she was showered cleaned and then taken to the hall to sit down when she was seen later in the morning at around 7 AM. She was noted to be slumped and frothing around her mouth, drooling, she was noted not to respond well. EMS was called and patient was noted to have right-sided weakness, at that time, she was not able to speak. On arrival to the hospital patient had CT scan of the head which revealed subacute right parietal lobe stroke, she was having difficulty moving her right side of body. Because of dementia. It was difficult to evaluate the patient fully neurologically. Pt is currently waking up and engaging w/ SLP and NSG. Pt stated Dtr's name x1 and said "good morning". Unusre of pt's baseline verbal communication in light of baseline Alzheimer's Dis. Dtr stated pt was eating a regular(softer foods) diet using her dentures w/ no difficulty swallowing at meals at the NH.    Assessment / Plan / Recommendation Clinical Impression  Pt appeared to adequately tolerate trials of thin liquids and purees w/ no overt s/s of aspiration during/post oral intake. Pt appears at reduced  risk for aspiration w/ these trial consistencies following general aspiration precautions and given feeding assistance. Pt required feeding support d/t declined Cognitive status and RUE weakness. Pt exhibited no overt oral phase deficits w/ the trials given (puree and thins) w/out her dentures placed. Pt and Dtr were educated on need to take her time using them again and upgrading diet consistency as she continues to feel better d/t current illness/drowsiness and declined Cognitive status baseline. Dtr and pt educated on diet consistencies and the benefit from a pureed diet consistency in meeting nutritional needs w/ less oral phase demand until strength returns. Dtr agreed, and she will have pt practice wearing them when awake. ST services will f/u w/ toleration of diet and trials to upgrade as ready in next 1-3 days. Recommned meds in Puree for easier swalloiwng overall at this time. NSG updated and agreed.    Aspiration Risk   (reduced following precautions`)    Diet Recommendation  Dysphagia 1 w/ thin liquids; general aspiration precautions. More puree foods in diet while not wearing dentures(and even wearing dentures) d/t Cognitive decline and illness at this time  Medication Administration: Whole meds with puree or crushed if easier for swallowing d/t Cognitive decline and illness    Other  Recommendations Recommended Consults:  (Dietician as needed) Oral Care Recommendations: Oral care BID;Staff/trained caregiver to provide oral care   Follow up Recommendations  None    Frequency and Duration min 2x/week  1 week  Prognosis Prognosis for Safe Diet Advancement: Good Barriers to Reach Goals: Cognitive deficits (Dementia)      Swallow Study   General Date of Onset: 08/19/15 HPI: Pt is a 82 y.o. female with a known history of Hypertension, Alzheimer's disease, seizure disorder, for which patient is on Depakote who presents to the hospital with complaints of unresponsiveness earlier  today. When the patient's family and caregiver. Patient was woken up at around 5:30 AM she was showered cleaned and then taken to the hall to sit down when she was seen later in the morning at around 7 AM. She was noted to be slumped and frothing around her mouth, drooling, she was noted not to respond well. EMS was called and patient was noted to have right-sided weakness, at that time, she was not able to speak. On arrival to the hospital patient had CT scan of the head which revealed subacute right parietal lobe stroke, she was having difficulty moving her right side of body. Because of dementia. It was difficult to evaluate the patient fully neurologically. Pt is currently waking up and engaging w/ SLP and NSG. Pt stated Dtr's name x1 and said "good morning". Unusre of pt's baseline verbal communication in light of baseline Alzheimer's Dis. Dtr stated pt was eating a regular(softer foods) diet using her dentures w/ no difficulty swallowing at meals at the NH.  Type of Study: Bedside Swallow Evaluation Previous Swallow Assessment: none Diet Prior to this Study: Dysphagia 3 (soft);Thin liquids (described) Temperature Spikes Noted: No (wbc not elevated) Respiratory Status: Room air History of Recent Intubation: No Behavior/Cognition: Alert;Cooperative;Pleasant mood;Confused;Requires cueing (nonverbal) Oral Cavity Assessment: Within Functional Limits Oral Care Completed by SLP: Recent completion by staff Oral Cavity - Dentition: Edentulous (wears her dentures but not wearing them currently) Vision:  (n/a) Self-Feeding Abilities: Needs assist;Needs set up;Total assist (more Cognitive impact as well) Patient Positioning: Upright in bed Baseline Vocal Quality:  (nonverbal) Volitional Cough: Cognitively unable to elicit Volitional Swallow: Unable to elicit    Oral/Motor/Sensory Function Overall Oral Motor/Sensory Function: Within functional limits (w/ bolus management)   Ice Chips Ice chips: Not  tested   Thin Liquid Thin Liquid: Within functional limits Presentation: Straw (assisted; 4 trials) Other Comments: pt put head down and closed eyes    Nectar Thick Nectar Thick Liquid: Not tested   Honey Thick Honey Thick Liquid: Not tested   Puree Puree: Within functional limits Presentation: Spoon (fed; 3 trials) Other Comments: pt put head down and closed eyes   Solid   GO   Solid: Not tested Other Comments: not wearing her dentures at this time         Jerilynn SomKatherine Watson, MS, CCC-SLP  Watson,Katherine 08/20/2015,10:26 AM

## 2015-08-20 NOTE — Plan of Care (Signed)
Problem: SLP Dysphagia Goals Goal: Misc Dysphagia Goal Pt will safely tolerate po diet of least restrictive consistency w/ no overt s/s of aspiration noted by Staff/pt/family x3 sessions.    

## 2015-08-21 LAB — LIPID PANEL
Cholesterol: 176 mg/dL (ref 0–200)
HDL: 54 mg/dL (ref >40)
LDL CALC: 113 mg/dL — AB (ref 0–99)
TRIGLYCERIDES: 46 mg/dL (ref <150)
Total CHOL/HDL Ratio: 3.3 RATIO
VLDL: 9 mg/dL (ref 0–40)

## 2015-08-21 LAB — VALPROIC ACID LEVEL: Valproic Acid Lvl: 33 ug/mL — ABNORMAL LOW (ref 50.0–100.0)

## 2015-08-21 NOTE — Progress Notes (Signed)
Pt arouses to voice but unable to follow commands. Able to move and control left side more than right. No verbal communication during this shift. Tele remains SR with PAC's. Took meds crushed in applesauce without difficulty

## 2015-08-21 NOTE — Progress Notes (Signed)
[facility] [place] at [facility]   PATIENT NAME: Mary GentileMildred [name]    MR#:  [id number]  DATE OF BIRTH:  04/01/1921  SUBJECTIVE:  CHIEF COMPLAINT:   Chief Complaint  Patient presents with  . Loss of Consciousness   Drowsy. Was able to tolerate dysphagia diet well. Non verbal. Family at bedside.  REVIEW OF SYSTEMS:    Review of Systems  Unable to perform ROS: dementia   DRUG ALLERGIES:  No Known Allergies  VITALS:  Blood pressure 175/61, pulse 62, temperature 99.7 F (37.6 C), temperature source Oral, resp. rate 19, height 5\' 1"  (1.549 m), weight 47.083 kg (103 lb 12.8 oz), SpO2 98 %.  PHYSICAL EXAMINATION:   Physical Exam  GENERAL:  82 y.o.-year-old patient lying in the bed with drowzy EYES: Pupils equal, round, reactive to light and accommodation. No scleral icterus. Extraocular muscles intact.  HEENT: Head atraumatic, normocephalic. Oropharynx and nasopharynx clear.  NECK:  Supple, no jugular venous distention. No thyroid enlargement, no tenderness.  LUNGS: Normal breath sounds bilaterally, no wheezing, rales, rhonchi. No use of accessory muscles of respiration.  CARDIOVASCULAR: S1, S2 normal. No murmurs, rubs, or gallops.  ABDOMEN: Soft, nontender, nondistended. Bowel sounds present. No organomegaly or mass.  EXTREMITIES: No cyanosis, clubbing or edema b/l.    NEUROLOGIC: Awake. No following commands Motor strength decreased on right compared to left PSYCHIATRIC: The patient is drowzy SKIN: No obvious rash, lesion, or ulcer.   LABORATORY PANEL:   CBC  Recent Labs Lab 08/20/15 0308  WBC 5.8  HGB 11.0*  HCT 32.4*  PLT 179   ------------------------------------------------------------------------------------------------------------------ Chemistries   Recent Labs Lab 08/20/15 0308  NA 139  K 4.0  CL 105  CO2 31  GLUCOSE 82  BUN 18  CREATININE 0.66  CALCIUM 8.7*    ------------------------------------------------------------------------------------------------------------------  Cardiac Enzymes  Recent Labs Lab 08/19/15 0742  TROPONINI <0.03   ------------------------------------------------------------------------------------------------------------------  RADIOLOGY:  Ct Head Wo Contrast  08/20/2015  CLINICAL DATA:  Recent left pontine infarct.  Dementia. EXAM: CT HEAD WITHOUT CONTRAST TECHNIQUE: Contiguous axial images were obtained from the base of the skull through the vertex without intravenous contrast. COMPARISON:  Head CT and brain MRI August 19, 2015 FINDINGS: Brain: Mild diffuse atrophy is stable. There is no intracranial mass hemorrhage, extra-axial fluid collection, or midline shift. The known acute infarct in the left medial pons is subtly present on axial slice 8 series 6. It does not appear to have progressed compared to 1 day prior, although direct comparison with the MR is difficult. There is evidence of a prior infarct near the right frontal -parietal junction, stable. There is widespread small vessel disease throughout the centra semiovale bilaterally. These changes are stable compared to 1 day prior. No new infarct is seen compared to 1 day prior. Vascular: There is calcification in the cavernous carotid artery regions bilaterally. Skull: The bony calvarium appears intact. Sinuses/Orbits: Visualized paranasal sinuses are clear. Visualized orbits are symmetric bilaterally. Other: Mastoid air cells are clear. IMPRESSION: Atrophy with extensive supratentorial small vessel disease. Prior infarct at the right frontal-parietal junction. Subtle medial left pontine infarct, better seen on MR. No new infarct evident compared to 1 day prior. There are areas of vascular calcification in the cavernous carotid arteries. Electronically Signed   By: Bretta BangWilliam  Woodruff III M.D.   On: 08/20/2015 18:13   Mr Brain Wo Contrast  08/19/2015  CLINICAL DATA:   82 year old hypertensive female with Alzheimer's disease and seizure disorder. Unresponsive episode. Subsequent encounter.  EXAM: MRI HEAD WITHOUT CONTRAST TECHNIQUE: Multiplanar, multiecho pulse sequences of the brain and surrounding structures were obtained without intravenous contrast. COMPARISON:  08/19/2015 head CT.  10/16/2014 limited MR. FINDINGS: Acute nonhemorrhagic left paracentral pontine infarct. Remote posterior right frontal -parietal lobe infarct with encephalomalacia. Remote tiny bilateral cerebellar infarcts and left lenticular nucleus infarct. Marked confluent periventricular/subcortical white matter changes consistent with result of small vessel disease. Global atrophy without hydrocephalus. Major intracranial vascular structures are patent. No intracranial mass lesion noted on this unenhanced exam. No evidence of mesial temporal sclerosis. Mild cervical spondylotic changes C5-6 with minimal cord flattening. Minimal mucosal thickening ethmoid sinus air cells. Post lens replacement without acute orbital abnormality. IMPRESSION: Acute nonhemorrhagic left paracentral pontine infarct. Remote posterior right frontal -parietal lobe infarct with encephalomalacia. Remote tiny bilateral cerebellar infarcts and left lenticular nucleus infarct. Marked confluent periventricular/subcortical white matter changes consistent with result of small vessel disease. Global atrophy. Electronically Signed   By: Lacy DuverneySteven  Olson M.D.   On: 08/19/2015 16:52   [place] Carotid Bilateral  08/19/2015  CLINICAL DATA:  Stroke. History of hypertension, Alzheimer's disease, seizures. EXAM: BILATERAL CAROTID DUPLEX ULTRASOUND TECHNIQUE: Wallace CullensGray scale imaging, color Doppler and duplex ultrasound were performed of bilateral carotid and vertebral arteries in the neck. COMPARISON:  MRI of the head August 19, 2015 at 1625 hours FINDINGS: Criteria: Quantification of carotid stenosis is based on velocity parameters that correlate the residual  internal carotid diameter with NASCET-based stenosis levels, using the diameter of the distal internal carotid lumen as the denominator for stenosis measurement. The following velocity measurements were obtained: RIGHT ICA:  39/10 cm/sec CCA:  92/14 cm/sec SYSTOLIC ICA/CCA RATIO:  0.4 DIASTOLIC ICA/CCA RATIO:  0.8 ECA:  59 cm/sec LEFT ICA:  46/13 cm/sec CCA:  76/12 cm/sec SYSTOLIC ICA/CCA RATIO:  0.6 DIASTOLIC ICA/CCA RATIO:  1.1 ECA:  57 cm/sec RIGHT CAROTID ARTERY: Common carotid artery is widely patent with normal color flow and spectral wave words. Minimal plaque internal carotid bulb, which appears widely patent with normal color flow and spectral waveforms. RIGHT VERTEBRAL ARTERY: Patent with normal antegrade spectral waveforms. LEFT CAROTID ARTERY: Common carotid artery is widely patent with normal color flow and spectral wave words. Minimal plaque internal carotid bulb, which appears widely patent with normal color flow and spectral waveforms. LEFT VERTEBRAL ARTERY: Patent with normal antegrade spectral waveforms. Irregular heart beat noted on all spectral waveforms. IMPRESSION: No hemodynamically significant stenosis. Irregular heart beat. Electronically Signed   By: Awilda Metroourtnay  Bloomer M.D.   On: 08/19/2015 17:57     ASSESSMENT AND PLAN:   # Acute left pontine CVA Plavix, Statin Physical therapy, occupational therapy, speech consultation. On Dysphagia diet. Echocardiogram and carotid Doppler showed nothing acute  skilled nursing facility if patient can participate in physical therapy Lovenox for DVT prophylaxis. CT scan of the head repeated due to some worsening. No bleed or any changes.  #. Suspected seizure with subtherapeutic Depakote level, advanced Depakote to 250 twice daily.  #3. Hyperglycemia HbA1c is 5.9  #4. Dementia, continue supportive therapy, home medication  #5. Essential hypertension  #6. A. fib, rate controlled, not on anticoagulation due to concerns of fall  All  the records are reviewed and case discussed with Care Management/Social Workerr. Management plans discussed with the patient, family and they are in agreement.  CODE STATUS: DNR  DVT Prophylaxis: SCDs  TOTAL TIME TAKING CARE OF THIS PATIENT: 35 minutes.   POSSIBLE D/C IN 1-2 DAYS, DEPENDING ON CLINICAL CONDITION.  Orie FishermanSudini, Ruthe Roemer R M.D on  08/21/2015 at 12:45 PM  Between 7am to 6pm - Pager - [phone]  After 6pm go to [email or web address] - password [email or web address]  [facility]  Office  [phone]  CC: Primary care physician; Olevia Perches, DO  Note: This dictation was prepared with Dragon dictation along with smaller phrase technology. Any transcriptional errors that result from this process are unintentional.

## 2015-08-21 NOTE — Progress Notes (Signed)
CSW spoke to patient's daughter over the phone. She reports that [facility] [place] would be her #1 choice. CSW left voicemail for admissions Coordinator. Awaiting phone call back. CSW will continue to follow and assist.  Woodroe Modehristina Willy Pinkerton, MSW, LCSW-A, LCAS-A Clinical Social Worker [phone]

## 2015-08-21 NOTE — Progress Notes (Signed)
PT Cancellation Note  Patient Details Name: Mary [name] MRN: [id number] DOB: 10/04/1921   Cancelled Treatment:    Reason Eval/Treat Not Completed: Other (comment). Evaluation re-attempted this date. Pt remains lethargic this date. Pt opens her eyes briefly to voice, however then falls asleep and unable to participate in care. Resistance noted on L LE compared to R LE. Will re-attempt next date in PM as daughter reports she is typically more awake in the afternoon.   Barclay Lennox 08/21/2015, 10:23 AM  Elizabeth PalauStephanie Orrie Lascano, PT, DPT [phone]

## 2015-08-21 NOTE — Progress Notes (Signed)
Patient has been similar to yesterday. Right side still weaker than left. Patient has been completely nonverbal today. She has opened her eyes a few times to eat and will open her eyes when you move her in the bed. Daughter has been at bedside and has fed her meals. No evidence of aspiration, unless patient is silently aspirating. Lungs are a little coarse, but patient is not able to cough well. Still swallowing thin liquids well and taking medications with applesauce. Temperature has come down some and will continue to monitor.

## 2015-08-21 NOTE — Progress Notes (Signed)
Notified Dr. Elpidio AnisSudini while on unit that patient's temperature has gone up a little to 99.7. No new orders at that time. Have continued to monitor and now temp is 100.3 and 100.5. Patient was covered with a lot of blankets and per the family she gets very cold. Skin very warm to the touch. Will continue to assess. Protocol for tylenol is to treat over 101.5.

## 2015-08-22 MED ORDER — ATORVASTATIN CALCIUM 20 MG PO TABS: 40.0000 mg | ORAL_TABLET | Freq: Every day | ORAL | Status: DC

## 2015-08-22 MED ORDER — AMLODIPINE BESYLATE 10 MG PO TABS
10.0000 mg | ORAL_TABLET | Freq: Every day | ORAL | Status: DC
  Administered 2015-08-22: 10 mg via ORAL
  Filled 2015-08-22: qty 1

## 2015-08-22 MED ORDER — AMLODIPINE BESYLATE 5 MG PO TABS: 2.5000 mg | ORAL_TABLET | Freq: Every day | ORAL | Status: DC

## 2015-08-22 MED ORDER — LOSARTAN POTASSIUM 25 MG PO TABS
25.0000 mg | ORAL_TABLET | Freq: Every day | ORAL | Status: DC
  Administered 2015-08-22: 25 mg via ORAL
  Filled 2015-08-22: qty 1

## 2015-08-22 MED ORDER — ATORVASTATIN CALCIUM 40 MG PO TABS: 40.0000 mg | ORAL_TABLET | Freq: Every day | ORAL | Status: DC

## 2015-08-22 MED ORDER — AMLODIPINE BESYLATE 2.5 MG PO TABS: 2.5000 mg | ORAL_TABLET | Freq: Every day | ORAL | Status: DC

## 2015-08-22 NOTE — Progress Notes (Signed)
CSW contacted [facility] in [place] and requested they review patient's referral. Awaiting phone call back. CSW will continue to follow and assist.  Woodroe Modehristina Blenda Wisecup, MSW, LCSW-A, LCAS-A Clinical Social Worker [phone]

## 2015-08-22 NOTE — Care Management Important Message (Signed)
Important Message  Patient Details  Name: Mary [name] MRN: [id number] Date of Birth: 07/15/1921   Medicare Important Message Given:  Yes    Olegario MessierKathy A Quandre Polinski 08/22/2015, 11:19 AM

## 2015-08-22 NOTE — Progress Notes (Signed)
Patient rested for most of the night. She is non verbal and could not follow instruction. She is able to move her LUE and LLE. Dr. Sheryle Hailiamond was paged for SBP>160. 10mg  of Norvasc was administered per order. Will continue to monitor.

## 2015-08-22 NOTE — Progress Notes (Signed)
Patient unable to demonstrate any movement of her right limbs.   Weak grip on the right.  Able to move and hold her lower  leg up for > 5 seconds. No verbal response.Daughter at bedside.

## 2015-08-22 NOTE — Progress Notes (Signed)
Discharged via EMS to [facility].

## 2015-08-22 NOTE — Progress Notes (Signed)
OT Cancellation Note  Patient Details Name: Mary GentileMildred [name] MRN: [id number] DOB: 09/07/1921   Cancelled Treatment:    Reason Eval/Treat Not Completed: Medical issues which prohibited therapy (Low heart rate. Will conitnue to monitor, and intervene as appropriate. Pt. is currently [facility] with ADLs, and is looking for LTC placement.)   Mary MessierElaine Jujuan Dugo, MS, OTR/L   Mary [name] 08/22/2015, 12:03 PM

## 2015-08-22 NOTE — Discharge Summary (Addendum)
[facility] [place] [facility]   PATIENT NAME: Mary GentileMildred Olivarez    MR#:  [id number]  DATE OF BIRTH:  03/31/1921  DATE OF ADMISSION:  08/19/2015 ADMITTING PHYSICIAN: Katharina Caperima Vaickute, MD  DATE OF DISCHARGE: 08/22/2015  PRIMARY CARE PHYSICIAN: Olevia PerchesMegan Johnson, DO   ADMISSION DIAGNOSIS:  CVA (cerebral infarction) [I63.9] Right leg weakness [R29.898] Right arm weakness [R29.898] Right sided weakness [M62.89]  DISCHARGE DIAGNOSIS:  Principal Problem:   CVA (cerebral infarction) Active Problems:   Right sided weakness   Seizure secondary to subtherapeutic anticonvulsant medication (HCC)   Hyperglycemia   SECONDARY DIAGNOSIS:   Past Medical History  Diagnosis Date  . Arthritis   . Hypertension   . Kidney disease   . Frequent falls   . Alzheimer disease   . Seizure (HCC)      ADMITTING HISTORY   HISTORY OF PRESENT ILLNESS: Mary [name] is a 82 y.o. female with a known history of Hypertension, Alzheimer's disease, seizure disorder, for which patient is on Depakote who presents to the hospital with complaints of unresponsiveness earlier today. When the patient's family and caregiver. Patient was woken up at around 5:30 AM she was showered cleaned and then taken to the hall to sit down when she was seen later in the morning at around 7 AM. She was noted to be slumped and frothing around her mouth, drooling, she was noted not to respond well. EMS was called and patient was noted to have right-sided weakness, at that time, she was not able to speak. On arrival to the hospital patient had CT scan of the head which revealed subacute right parietal lobe stroke, she was having difficulty moving her right side of body. Because of dementia. It was difficult to evaluate the patient fully neurologically since patient was not able to follow commands. Hospitalist services were contacted for admission.   HOSPITAL COURSE:   # Acute left pontine CVA Plavix, Statin Physical  therapy, occupational therapy, speech consultation to continue at rehab. On Dysphagia diet. Echocardiogram and carotid Doppler showed nothing acute CT scan of the head repeated due to some worsening. No bleed or any changes.  Discussed with family regarding patient's prognosis. Patient is able to eat her dysphagia diet at this time and able to meet nutritional needs. If there is any worsening in the future patient will benefit from hospice involvement. I have discussed with family regarding goals of care and they would like to wait and see how she patient does at rehabilitation prior to deciding on this.  #. Suspected seizure with subtherapeutic Depakote level, advanced Depakote to 250 twice daily.  #3. Hyperglycemia HbA1c is 5.9  #4. Dementia, continue supportive therapy, home medication  #5. Essential hypertension  #6. A. fib, rate controlled, not on anticoagulation due to concerns of fall  Patient is stable for her discharge to skilled nursing facility. Has guarded prognosis depending on her progress with the stroke.  CONSULTS OBTAINED:  Treatment Team:  Pauletta BrownsYuriy Zeylikman, MD  DRUG ALLERGIES:  No Known Allergies  DISCHARGE MEDICATIONS:   Current Discharge Medication List    START taking these medications   Details  amLODipine (NORVASC) 2.5 MG tablet Take 1 tablet (2.5 mg total) by mouth daily.    atorvastatin (LIPITOR) 40 MG tablet Take 1 tablet (40 mg total) by mouth daily at 6 PM.    clopidogrel (PLAVIX) 75 MG tablet Take 1 tablet (75 mg total) by mouth daily.      CONTINUE these medications which have  NOT CHANGED   Details  acetaminophen (TYLENOL) 325 MG tablet Take 325-650 mg by mouth every 6 (six) hours as needed for mild pain or headache.     alum & mag hydroxide-simeth (MAALOX/MYLANTA) 200-200-20 MG/5ML suspension Take 30 mLs by mouth as needed for indigestion or heartburn.     bismuth subsalicylate (PEPTO BISMOL) 262 MG/15ML suspension Take 30 mLs by mouth every  6 (six) hours as needed (for GI upset).    Cranberry Fruit 405 MG CAPS TAKE 1 CAPSULE BY MOUTH TWICE DAILY TO PREVENT UTI Qty: 60 each, Refills: 12    divalproex (DEPAKOTE SPRINKLE) 125 MG capsule Take 125 mg by mouth 2 (two) times daily.    donepezil (ARICEPT) 10 MG tablet TAKE 1 TABLET BY MOUTH ONCE DAILY FOR MEMORY Qty: 30 tablet, Refills: 6    GNP VITAMIN D SUPER STRENGTH 5000 units TABS TAKE 1 TABLET BY MOUTH ONCE DAILY FOR SUPPLEMENT Qty: 30 each, Refills: 12    latanoprost (XALATAN) 0.005 % ophthalmic solution Place 1 drop into both eyes at bedtime.    loperamide (IMODIUM A-D) 2 MG tablet Take 2 mg by mouth 4 (four) times daily as needed for diarrhea or loose stools.    magnesium hydroxide (MILK OF MAGNESIA) 400 MG/5ML suspension Take 30 mLs by mouth daily as needed for mild constipation.     Multiple Vitamin (TAB-A-VITE) TABS TAKE 1 TABLET BY MOUTH ONCE DAILY FOR SUPPLEMENT Qty: 30 each, Refills: 12      STOP taking these medications     ASPIR-LOW 81 MG EC tablet      traZODone (DESYREL) 50 MG tablet         Today   VITAL SIGNS:  Blood pressure 159/68, pulse 52, temperature 97.5 F (36.4 C), temperature source Oral, resp. rate 19, height  (1.549 m), weight 47.083 kg (103 lb 12.8 oz), SpO2 100 %.  I/O:   Intake/Output Summary (Last 24 hours) at 08/22/15 1305 Last data filed at 08/22/15 1004  Gross per 24 hour  Intake    240 ml  Output      0 ml  Net    240 ml    PHYSICAL EXAMINATION:  Physical Exam  GENERAL:  82 y.o.-year-old patient lying in the bed with no acute distress LUNGS: Normal breath sounds bilaterally, no wheezing, rales,rhonchi or crepitation. No use of accessory muscles of respiration CARDIOVASCULAR: S1, S2 normal. No murmurs, rubs, or gallops ABDOMEN: Soft, non-tender, non-distended. Bowel sounds present. No organomegaly or mass NEUROLOGIC: Flaccid right side. PSYCHIATRIC: The patient is awake and non verbal. SKIN: No obvious rash,  lesion, or ulcer  DATA REVIEW:   CBC  Recent Labs Lab 08/20/15 0308  WBC 5.8  HGB 11.0*  HCT 32.4*  PLT 179    Chemistries   Recent Labs Lab 08/20/15 0308  NA 139  K 4.0  CL 105  CO2 31  GLUCOSE 82  BUN 18  CREATININE 0.66  CALCIUM 8.7*    Cardiac Enzymes  Recent Labs Lab 08/19/15 0742  TROPONINI <0.03    Microbiology Results  Results for orders placed or performed during the hospital encounter of 08/19/15  MRSA PCR Screening     Status: None   Collection Time: 08/19/15  1:05 PM  Result Value Ref Range Status   MRSA by PCR NEGATIVE NEGATIVE Final    Comment:        The GeneXpert MRSA Assay (FDA approved for NASAL specimens only), is one component of a comprehensive MRSA colonization  surveillance program. It is not intended to diagnose MRSA infection nor to guide or monitor treatment for MRSA infections.     RADIOLOGY:  Ct Head Wo Contrast  08/20/2015  CLINICAL DATA:  Recent left pontine infarct.  Dementia. EXAM: CT HEAD WITHOUT CONTRAST TECHNIQUE: Contiguous axial images were obtained from the base of the skull through the vertex without intravenous contrast. COMPARISON:  Head CT and brain MRI August 19, 2015 FINDINGS: Brain: Mild diffuse atrophy is stable. There is no intracranial mass hemorrhage, extra-axial fluid collection, or midline shift. The known acute infarct in the left medial pons is subtly present on axial slice 8 series 6. It does not appear to have progressed compared to 1 day prior, although direct comparison with the MR is difficult. There is evidence of a prior infarct near the right frontal -parietal junction, stable. There is widespread small vessel disease throughout the centra semiovale bilaterally. These changes are stable compared to 1 day prior. No new infarct is seen compared to 1 day prior. Vascular: There is calcification in the cavernous carotid artery regions bilaterally. Skull: The bony calvarium appears intact. Sinuses/Orbits:  Visualized paranasal sinuses are clear. Visualized orbits are symmetric bilaterally. Other: Mastoid air cells are clear. IMPRESSION: Atrophy with extensive supratentorial small vessel disease. Prior infarct at the right frontal-parietal junction. Subtle medial left pontine infarct, better seen on MR. No new infarct evident compared to 1 day prior. There are areas of vascular calcification in the cavernous carotid arteries. Electronically Signed   By: Bretta BangWilliam  Woodruff III M.D.   On: 08/20/2015 18:13    Follow up with PCP in 1 week.  Management plans discussed with the patient, family and they are in agreement.  CODE STATUS:     Code Status Orders        Start     Ordered   08/19/15 1233  Do not attempt resuscitation (DNR)   Continuous    Question Answer Comment  In the event of cardiac or respiratory ARREST Do not call a "code blue"   In the event of cardiac or respiratory ARREST Do not perform Intubation, CPR, defibrillation or ACLS   In the event of cardiac or respiratory ARREST Use medication by any route, position, wound care, and other measures to relive pain and suffering. May use oxygen, suction and manual treatment of airway obstruction as needed for comfort.      08/19/15 1232    Code Status History    Date Active Date Inactive Code Status Order ID Comments User Context   10/15/2014 11:27 PM 10/17/2014  7:10 PM Full Code [id number]  Wyatt Hasteavid K Hower, MD ED    Advance Directive Documentation        Most Recent Value   Type of Advance Directive  Healthcare Power of Attorney   Pre-existing out of facility DNR order (yellow form or pink MOST form)     "MOST" Form in Place?        TOTAL TIME TAKING CARE OF THIS PATIENT ON DAY OF DISCHARGE: more than 30 minutes.   Milagros LollSudini, Rojelio Uhrich R M.D on 08/22/2015 at 1:05 PM  Between 7am to 6pm - Pager - [phone]  After 6pm go to [email or web address] - password [email or web address]  [facility]  Office  [phone]  CC: Primary care  physician; Olevia PerchesMegan Johnson, DO  Note: This dictation was prepared with Dragon dictation along with smaller phrase technology. Any transcriptional errors that result from this process are unintentional.

## 2015-08-22 NOTE — Progress Notes (Signed)
Pt sleeping with daughter (delores) present when ST entered room. Pt's daughter stated that pt is tolerating puree well and is consuming all meals. Nursing confirms. Pt's daughter provided that she has pt's dentures but doesn't want to have pt wear them at this point in recovery. Pt's daughter requests that pt remain on puree at current time. Trials of dysphagia 2 not administered this day per request. ST will follow up on next available time. Continue current plan of care and general aspiration precautions.

## 2015-08-22 NOTE — Progress Notes (Signed)
Discharging patient to [facility].  Report called to "KAT" on the patient care unit.  Will contact EMS for transport

## 2015-08-22 NOTE — Progress Notes (Signed)
With the patient's both daughters regarding goals of care  Patient is DO NOT RESUSCITATE. She has acute stroke and has significant change with her mental status and right-sided weakness. Patient is being discharged to skilled nursing facility in the hope that she will improve. I have discussed with them regarding transition to comfort measures if no improvement or need for PEG tube if she is unable to meet her nutritional needs. They're hopeful patient will improve with therapy at rehabilitation. Would like to give her some time prior to making these decisions.  Total time spent greater than 15 minutes.

## 2015-08-22 NOTE — Progress Notes (Signed)
PT Cancellation Note  Patient Details Name: Celesta GentileMildred Muramoto MRN: [id number] DOB: 03/05/1921   Cancelled Treatment:    Reason Eval/Treat Not Completed: Other (comment). 3rd attempt made for PT evaluation. Pt unable to participate for intense therapy at this time due to lethargy/weakness. Pt able to open eyes for brief periods with sternal rubbing. Pt shows increased movement on L side, however R side flaccid. Unable to tolerate SNF level at this time. Non-verbal. Recommend 24/7 total care at LTC. CSW updated. Will sign off at this time. Please re-consult if needs change.   Judithann Villamar 08/22/2015, 11:22 AM Elizabeth PalauStephanie Julie-Anne Torain, PT, DPT [phone]

## 2015-08-22 NOTE — Clinical Social Work Placement (Signed)
   CLINICAL SOCIAL WORK PLACEMENT  NOTE  Date:  08/22/2015  Patient Details  Name: Mary [name] MRN: [id number] Date of Birth: 10/14/1921  Clinical Social Work is seeking post-discharge placement for this patient at the Skilled  Nursing Facility level of care (*CSW will initial, date and re-position this form in  chart as items are completed):  No   Patient/family provided with [facility] Clinical Social Work Department's list of facilities offering this level of care within the geographic area requested by the patient (or if unable, by the patient's family).  No   Patient/family informed of their freedom to choose among providers that offer the needed level of care, that participate in Medicare, Medicaid or managed care program needed by the patient, have an available bed and are willing to accept the patient.  No   Patient/family informed of [facility] ownership interest [facility] and [facility], as well as of the fact that they are under no obligation to receive care at these facilities.  PASRR submitted to EDS on       PASRR number received on       Existing PASRR number confirmed on 08/22/15     FL2 transmitted to all facilities in geographic area requested by pt/family on 08/22/15     FL2 transmitted to all facilities within larger geographic area on       Patient informed that his/her managed care company has contracts with or will negotiate with certain facilities, including the following:        Yes   Patient/family informed of bed offers received.  Patient chooses bed at  ([facility])     Physician recommends and patient chooses bed at      Patient to be transferred to  ([facility]) on 08/22/15.  Patient to be transferred to facility by  (EMS)     Patient family notified on 08/22/15 of transfer.  Name of family member notified:   (Daughter)     PHYSICIAN       Additional Comment:    _______________________________________________ Idamae Lusherhristina E Sadeen Wiegel,  LCSW 08/22/2015, 1:34 PM

## 2015-08-22 NOTE — Progress Notes (Signed)
Clinical Social Worker presented bed offers to patient's daughter. Accepted bed offer at [facility]. Informed Joseph- Admissions Coordinator at [facility]. CSW was informed that patient will be medically ready to discharge to [facility]. Patient's family are in a agreement with plan. CSW called Jomarie LongsJoseph- Admissions Coordinator at [facility] to confirm that patient's bed is ready. Provided patient's room number 710 and number to call for report [phone] . All discharge information faxed to [facility] via [facility]. DNR added to discharge packet. RN will call report and patient will discharge to [facility] via EMS.  Woodroe Modehristina Rhetta Cleek, MSW, LCSW-A, LCAS-A Clinical Social Worker [phone]

## 2015-08-30 ENCOUNTER — Inpatient Hospital Stay: Payer: Medicare Other | Admitting: Family Medicine

## 2015-09-17 ENCOUNTER — Encounter: Payer: Self-pay | Admitting: Internal Medicine

## 2015-09-17 ENCOUNTER — Non-Acute Institutional Stay (SKILLED_NURSING_FACILITY): Payer: Medicare Other | Admitting: Internal Medicine

## 2015-09-17 DIAGNOSIS — IMO0002 Reserved for concepts with insufficient information to code with codable children: Secondary | ICD-10-CM

## 2015-09-17 DIAGNOSIS — I4891 Unspecified atrial fibrillation: Secondary | ICD-10-CM | POA: Diagnosis not present

## 2015-09-17 DIAGNOSIS — G40909 Epilepsy, unspecified, not intractable, without status epilepticus: Secondary | ICD-10-CM

## 2015-09-17 DIAGNOSIS — I69921 Dysphasia following unspecified cerebrovascular disease: Secondary | ICD-10-CM

## 2015-09-17 DIAGNOSIS — I10 Essential (primary) hypertension: Secondary | ICD-10-CM | POA: Diagnosis not present

## 2015-09-17 DIAGNOSIS — I69320 Aphasia following cerebral infarction: Secondary | ICD-10-CM | POA: Diagnosis not present

## 2015-09-17 DIAGNOSIS — E46 Unspecified protein-calorie malnutrition: Secondary | ICD-10-CM

## 2015-09-17 DIAGNOSIS — I639 Cerebral infarction, unspecified: Secondary | ICD-10-CM | POA: Diagnosis not present

## 2015-09-17 DIAGNOSIS — I69391 Dysphagia following cerebral infarction: Secondary | ICD-10-CM

## 2015-09-17 DIAGNOSIS — R5381 Other malaise: Secondary | ICD-10-CM | POA: Diagnosis not present

## 2015-09-17 DIAGNOSIS — M2450 Contracture, unspecified joint: Secondary | ICD-10-CM | POA: Diagnosis not present

## 2015-09-17 NOTE — Progress Notes (Signed)
This encounter was created in error - please disregard.

## 2015-09-17 NOTE — Progress Notes (Signed)
LOCATION: [facility]  PCP: Olevia Perches, DO   Code Status: DNR  Goals of care: Advanced Directive information Advanced Directives 09/17/2015  Does patient have an advance directive? Yes  Type of Advance Directive Out of facility DNR (pink MOST or yellow form)  Does patient want to make changes to advanced directive? No - Patient declined  Copy of advanced directive(s) in chart? Yes  Would patient like information on creating an advanced directive? -       Extended Emergency Contact Information Primary Emergency Contact: Sturdivant,Deloris Address: [address]          [place], [place] [address] [place] Home Phone: [phone] Relation: Daughter Secondary Emergency Contact: Ross,Jacqueline Address: [address]          [place], [place] [address] [place] Home Phone: [phone] Relation: Daughter   No Known Allergies  Chief Complaint  Patient presents with  . New Admit To SNF    New Admission     HPI:  Patient is a 82 y.o. female seen today for short term rehabilitation and possible long term care post hospital admission and stay in another SNF post acute left pontine CVA and possible seizure. She is seen in her room today. She has dementia and aphasia and is unable to participate in HPI and ROS. She has PMH of CVA, seizure disorder, dementia, HTN, afib among others.   Review of Systems: Unable to obtain     Past Medical History:  Diagnosis Date  . Alzheimer disease   . Arthritis   . Frequent falls   . Hypertension   . Kidney disease   . Seizure [facility])    Past Surgical History:  Procedure Laterality Date  . TUBAL LIGATION     Social History:   reports that she has never smoked. She has never used smokeless tobacco. She reports that she does not drink alcohol or use drugs.  Family History  Problem Relation Age of Onset  . Hypertension Mother   . Stroke Mother   . Heart disease Mother   . Hypertension Father    . Heart attack Father   . Stroke Sister   . Diabetes Sister     Medications:   Medication List       Accurate as of 09/17/15 10:58 AM. Always use your most recent med list.          acetaminophen 500 MG tablet Commonly known as:  TYLENOL Take 500 mg by mouth every 6 (six) hours as needed for mild pain or headache.   alum & mag hydroxide-simeth 200-200-20 MG/5ML suspension Commonly known as:  MAALOX/MYLANTA Take 30 mLs by mouth as needed for indigestion or heartburn.   amLODipine 2.5 MG tablet Commonly known as:  NORVASC Take 1 tablet (2.5 mg total) by mouth daily.   atorvastatin 40 MG tablet Commonly known as:  LIPITOR Take 1 tablet (40 mg total) by mouth daily at 6 PM.   bismuth subsalicylate 262 MG/15ML suspension Commonly known as:  PEPTO BISMOL Take 30 mLs by mouth every 6 (six) hours as needed (for GI upset).   clopidogrel 75 MG tablet Commonly known as:  PLAVIX Take 1 tablet (75 mg total) by mouth daily.   Cranberry Fruit 405 MG Caps TAKE 1 CAPSULE BY MOUTH TWICE DAILY TO PREVENT UTI   divalproex 125 MG capsule Commonly known as:  DEPAKOTE SPRINKLE Take 250 mg by mouth 2 (two) times daily.   donepezil 10 MG  tablet Commonly known as:  ARICEPT TAKE 1 TABLET BY MOUTH ONCE DAILY FOR MEMORY   GNP VITAMIN D SUPER STRENGTH 5000 units Tabs Generic drug:  Cholecalciferol TAKE 1 TABLET BY MOUTH ONCE DAILY FOR SUPPLEMENT   latanoprost 0.005 % ophthalmic solution Commonly known as:  XALATAN Place 1 drop into both eyes at bedtime.   loperamide 2 MG tablet Commonly known as:  IMODIUM A-D Take 2 mg by mouth 4 (four) times daily as needed for diarrhea or loose stools.   magnesium hydroxide 400 MG/5ML suspension Commonly known as:  MILK OF MAGNESIA Take 30 mLs by mouth daily as needed for mild constipation.   TAB-A-VITE Tabs TAKE 1 TABLET BY MOUTH ONCE DAILY FOR SUPPLEMENT       Immunizations: Immunization History  Administered Date(s) Administered  .  Influenza Whole 12/29/2013  . Influenza-Unspecified 11/27/2014  . PPD Test 09/14/2015  . Td 01/11/2015     Physical Exam:  Vitals:   09/17/15 1051  BP: 126/73  Pulse: 63  Resp: 17  Temp: 97.7 F (36.5 C)  TempSrc: Oral  SpO2: 97%  Weight: 103 lb 12.8 oz (47.1 kg)  Height:  (1.549 m)   Body mass index is 19.61 kg/m.  General- elderly female, frail and thin built, in no acute distress Head- normocephalic, atraumatic Nose- no nasal discharge Throat- moist mucus membrane Eyes- no pallor, no icterus, no discharge Neck- no cervical lymphadenopathy Cardiovascular- normal s1,s2, no murmur Respiratory- bilateral clear to auscultation, no wheeze, no rhonchi, no crackles, no use of accessory muscles Abdomen- bowel sounds present, soft, non tender Musculoskeletal- flaccid right side with some contracture to her knee and wrist, some movement to left side noted Neurological- unable to assess, has aphasia Skin- warm and dry Psychiatry- normal mood and affect    Labs reviewed: Basic Metabolic Panel:  Recent Labs  14/78/29 1849 08/19/15 0742 08/20/15 0308  NA 138 139 139  K 4.5 4.4 4.0  CL 100* 102 105  CO2 GLUCOSE 102* 110* 82  BUN CREATININE 0.86 0.67 0.66  CALCIUM 9.4 9.0 8.7*   Liver Function Tests:  Recent Labs  12/24/14 1324 05/21/15 1028 08/06/15 1849  AST 52* 40 47*  ALT 47 38* 37  ALKPHOS 98 78 66  BILITOT 0.3 <0.2 0.4  PROT 7.3 6.3 6.4*  ALBUMIN 3.0* 3.3 2.9*   No results for input(s): LIPASE, AMYLASE in the last 8760 hours. No results for input(s): AMMONIA in the last 8760 hours. CBC:  Recent Labs  10/02/14 2032  05/21/15 1028 08/06/15 1849 08/19/15 0742 08/20/15 0308  WBC 5.8  < > 6.1 6.5 8.0 5.8  NEUTROABS 3.5  --  3.8 4.0  --   --   HGB 11.3*  < >  --  12.1 12.1 11.0*  HCT 35.3  < > 37.1 37.8 35.9 32.4*  MCV 85.3  < > 89 88.7 86.5 86.2  PLT 202  < > 283 213 191 179  < > = values in this interval not  displayed. Cardiac Enzymes:  Recent Labs  10/16/14 0653 10/16/14 1445 08/19/15 0742  TROPONINI <0.03 0.03 <0.03   Lab Results  Component Value Date   HGBA1C 5.9 08/19/2015   Lipid Panel     Component Value Date/Time   CHOL 176 08/21/2015 0423   CHOL 203 (H) 05/21/2015 1028   TRIG 46 08/21/2015 0423   HDL 54 08/21/2015 0423   HDL 71 05/21/2015 1028   CHOLHDL  3.3 08/21/2015 0423   VLDL 9 08/21/2015 0423   LDLCALC 113 (H) 08/21/2015 0423   LDLCALC 115 (H) 05/21/2015 1028    Radiological Exams: Ct Head Wo Contrast  Result Date: 08/20/2015 CLINICAL DATA:  Recent left pontine infarct.  Dementia. EXAM: CT HEAD WITHOUT CONTRAST TECHNIQUE: Contiguous axial images were obtained from the base of the skull through the vertex without intravenous contrast. COMPARISON:  Head CT and brain MRI August 19, 2015 FINDINGS: Brain: Mild diffuse atrophy is stable. There is no intracranial mass hemorrhage, extra-axial fluid collection, or midline shift. The known acute infarct in the left medial pons is subtly present on axial slice 8 series 6. It does not appear to have progressed compared to 1 day prior, although direct comparison with the MR is difficult. There is evidence of a prior infarct near the right frontal -parietal junction, stable. There is widespread small vessel disease throughout the centra semiovale bilaterally. These changes are stable compared to 1 day prior. No new infarct is seen compared to 1 day prior. Vascular: There is calcification in the cavernous carotid artery regions bilaterally. Skull: The bony calvarium appears intact. Sinuses/Orbits: Visualized paranasal sinuses are clear. Visualized orbits are symmetric bilaterally. Other: Mastoid air cells are clear. IMPRESSION: Atrophy with extensive supratentorial small vessel disease. Prior infarct at the right frontal-parietal junction. Subtle medial left pontine infarct, better seen on MR. No new infarct evident compared to 1 day prior.  There are areas of vascular calcification in the cavernous carotid arteries. Electronically Signed   By: Bretta Bang III M.D.   On: 08/20/2015 18:13   Ct Head Wo Contrast  Result Date: 08/19/2015 CLINICAL DATA:  Unresponsive EXAM: CT HEAD WITHOUT CONTRAST TECHNIQUE: Contiguous axial images were obtained from the base of the skull through the vertex without intravenous contrast. COMPARISON:  01/11/2015 FINDINGS: The bony calvarium is intact. Diffuse atrophic changes and chronic white matter ischemic changes are seen. Additionally there is some decreased attenuation consistent with subacute infarct in the right parietal lobe. No hemorrhage, acute infarct or space-occupying mass lesion is identified. IMPRESSION: Chronic atrophic and ischemic changes. Changes consistent with subacute infarct in the right parietal lobe when compared with the prior exam. These results were called by telephone at the time of interpretation on 08/19/2015 at 8:37 am to Dr. Governor Rooks , who verbally acknowledged these results. Electronically Signed   By: Alcide Clever M.D.   On: 08/19/2015 08:37   Mr Brain Wo Contrast  Result Date: 08/19/2015 CLINICAL DATA:  82 year old hypertensive female with Alzheimer's disease and seizure disorder. Unresponsive episode. Subsequent encounter. EXAM: MRI HEAD WITHOUT CONTRAST TECHNIQUE: Multiplanar, multiecho pulse sequences of the brain and surrounding structures were obtained without intravenous contrast. COMPARISON:  08/19/2015 head CT.  10/16/2014 limited MR. FINDINGS: Acute nonhemorrhagic left paracentral pontine infarct. Remote posterior right frontal -parietal lobe infarct with encephalomalacia. Remote tiny bilateral cerebellar infarcts and left lenticular nucleus infarct. Marked confluent periventricular/subcortical white matter changes consistent with result of small vessel disease. Global atrophy without hydrocephalus. Major intracranial vascular structures are patent. No intracranial  mass lesion noted on this unenhanced exam. No evidence of mesial temporal sclerosis. Mild cervical spondylotic changes C5-6 with minimal cord flattening. Minimal mucosal thickening ethmoid sinus air cells. Post lens replacement without acute orbital abnormality. IMPRESSION: Acute nonhemorrhagic left paracentral pontine infarct. Remote posterior right frontal -parietal lobe infarct with encephalomalacia. Remote tiny bilateral cerebellar infarcts and left lenticular nucleus infarct. Marked confluent periventricular/subcortical white matter changes consistent with result of small vessel disease.  Global atrophy. Electronically Signed   By: Lacy Duverney M.D.   On: 08/19/2015 16:52   US Carotid Bilateral  Result Date: 08/19/2015 CLINICAL DATA:  Stroke. History of hypertension, Alzheimer's disease, seizures. EXAM: BILATERAL CAROTID DUPLEX ULTRASOUND TECHNIQUE: Wallace Cullens scale imaging, color Doppler and duplex ultrasound were performed of bilateral carotid and vertebral arteries in the neck. COMPARISON:  MRI of the head August 19, 2015 at 1625 hours FINDINGS: Criteria: Quantification of carotid stenosis is based on velocity parameters that correlate the residual internal carotid diameter with NASCET-based stenosis levels, using the diameter of the distal internal carotid lumen as the denominator for stenosis measurement. The following velocity measurements were obtained: RIGHT ICA:  39/10 cm/sec CCA:  92/14 cm/sec SYSTOLIC ICA/CCA RATIO:  0.4 DIASTOLIC ICA/CCA RATIO:  0.8 ECA:  59 cm/sec LEFT ICA:  46/13 cm/sec CCA:  76/12 cm/sec SYSTOLIC ICA/CCA RATIO:  0.6 DIASTOLIC ICA/CCA RATIO:  1.1 ECA:  57 cm/sec RIGHT CAROTID ARTERY: Common carotid artery is widely patent with normal color flow and spectral wave words. Minimal plaque internal carotid bulb, which appears widely patent with normal color flow and spectral waveforms. RIGHT VERTEBRAL ARTERY: Patent with normal antegrade spectral waveforms. LEFT CAROTID ARTERY: Common carotid  artery is widely patent with normal color flow and spectral wave words. Minimal plaque internal carotid bulb, which appears widely patent with normal color flow and spectral waveforms. LEFT VERTEBRAL ARTERY: Patent with normal antegrade spectral waveforms. Irregular heart beat noted on all spectral waveforms. IMPRESSION: No hemodynamically significant stenosis. Irregular heart beat. Electronically Signed   By: Awilda Metro M.D.   On: 08/19/2015 17:57    Assessment/Plan  Physical deconditioning With recent CVA. Will have patient work with PT/OT as tolerated to regain strength and restore function.  Fall precautions are in place.  Acute left pontine CVA With right sided hemiplegia and aphasia. Provide total care. Pressure ulcer prophylaxis. Continue plavix 75 mg daily and lipitor 40 mg daily. Lipid panel reviewed. Will have her work with physical therapy and occupational therapy team to help with gait training and muscle strengthening exercises.fall precautions. Skin care. Encourage to be out of bed.   Right extremities contracture PT and OT to work with patient. Continue tylenol 500 mg q6h prn and monitor  Seizure disorder Continue depakote 250 mg bid  Dysphagia Get SLP to evaluate  Aphasia Have SLP to evaluate, full assistance with feeding and aspiration precautions  Protein calorie malnutrition Get RD to evaluate, monitor weight and po intake  HTN Monitor bp, continue norvasc for now  afib Rate controlled, off rate controlling agent and not on anticoagulation with her fall risk    Goals of care: short term rehabilitation, possible long term care   Labs/tests ordered: cbc, bmp  Family/ staff Communication: reviewed care plan with patient and nursing supervisor    Oneal Grout, MD Internal Medicine [facility] [address] [place], [place] [address] Cell Phone (Monday-Friday 8 am - 5 pm): [phone] On Call: [phone] and  follow prompts after 5 pm and on weekends Office Phone: [phone] Office Fax: [phone]

## 2015-09-18 LAB — CBC AND DIFFERENTIAL
HCT: 41 % (ref 36–46)
Hemoglobin: 13 g/dL (ref 12.0–16.0)
PLATELETS: 242 10*3/uL (ref 150–399)
WBC: 5.5 10^3/mL

## 2015-09-18 LAB — HEPATIC FUNCTION PANEL
ALK PHOS: 86 U/L (ref 25–125)
ALT: 75 U/L — AB (ref 7–35)
AST: 54 U/L — AB (ref 13–35)
BILIRUBIN, TOTAL: 0.4 mg/dL

## 2015-09-18 LAB — BASIC METABOLIC PANEL
BUN: 14 mg/dL (ref 4–21)
CREATININE: 0.5 mg/dL (ref 0.5–1.1)
GLUCOSE: 85 mg/dL
Potassium: 4.4 mmol/L (ref 3.4–5.3)
SODIUM: 142 mmol/L (ref 137–147)

## 2015-09-19 ENCOUNTER — Non-Acute Institutional Stay (SKILLED_NURSING_FACILITY): Payer: Medicare Other | Admitting: Family

## 2015-09-19 DIAGNOSIS — E785 Hyperlipidemia, unspecified: Secondary | ICD-10-CM | POA: Diagnosis not present

## 2015-09-19 DIAGNOSIS — R7989 Other specified abnormal findings of blood chemistry: Secondary | ICD-10-CM | POA: Diagnosis not present

## 2015-09-19 DIAGNOSIS — E46 Unspecified protein-calorie malnutrition: Secondary | ICD-10-CM

## 2015-09-19 DIAGNOSIS — R945 Abnormal results of liver function studies: Principal | ICD-10-CM

## 2015-09-19 MED ORDER — ATORVASTATIN CALCIUM 40 MG PO TABS: 20.0000 mg | ORAL_TABLET | Freq: Every day | ORAL | Status: DC

## 2015-09-19 NOTE — Progress Notes (Signed)
Location:   [facility]    Place of Service:  SNF [phone]) Provider: Annaleigh Steinmeyer FNP-C    Olevia Perches, DO  Patient Care Team: Dorcas Carrow, DO as PCP - General (Family Medicine)  Extended Emergency Contact Information Primary Emergency Contact: Sturdivant,Deloris Address: [address]          [place], [place] [address] [place] of [place] Home Phone: [phone] Mobile Phone: [phone] Relation: Daughter Secondary Emergency Contact: Ross,Jacqueline Address: [address]          [place], [place] [address] [place] of [place] Home Phone: [phone] Relation: Daughter  Code Status:  DNR  Goals of care: Advanced Directive information Advanced Directives 09/17/2015  Does patient have an advance directive? Yes  Type of Advance Directive Out of facility DNR (pink MOST or yellow form)  Does patient want to make changes to advanced directive? No - Patient declined  Copy of advanced directive(s) in chart? Yes  Would patient like information on creating an advanced directive? -     Chief Complaint  Patient presents with  . Acute Visit    HPI:  Pt is a 82 y.o. female seen today at [facility]  for an acute visit for evaluation of abnormal lab results. She is seen in her room today lying in the bed. She is non-verbal during visit but follows provider with eye contact. Her recent lab results showed TP 5.9, Alb 2.83, AST 54, ALT 75 ( 09/18/2015). Facility staff reports no new concerns.     Past Medical History:  Diagnosis Date  . Alzheimer disease   . Arthritis   . Frequent falls   . Hypertension   . Kidney disease   . Seizure [facility])    Past Surgical History:  Procedure Laterality Date  . TUBAL LIGATION      No Known Allergies    Medication List       Accurate as of 09/19/15  4:02 PM. Always use your most recent med list.          acetaminophen 500 MG tablet Commonly known as:  TYLENOL Take 500 mg by mouth  every 6 (six) hours as needed for mild pain or headache.   alum & mag hydroxide-simeth 200-200-20 MG/5ML suspension Commonly known as:  MAALOX/MYLANTA Take 30 mLs by mouth as needed for indigestion or heartburn.   amLODipine 2.5 MG tablet Commonly known as:  NORVASC Take 1 tablet (2.5 mg total) by mouth daily.   atorvastatin 40 MG tablet Commonly known as:  LIPITOR Take 0.5 tablets (20 mg total) by mouth daily at 6 PM.   bismuth subsalicylate 262 MG/15ML suspension Commonly known as:  PEPTO BISMOL Take 30 mLs by mouth every 6 (six) hours as needed (for GI upset).   clopidogrel 75 MG tablet Commonly known as:  PLAVIX Take 1 tablet (75 mg total) by mouth daily.   Cranberry Fruit 405 MG Caps TAKE 1 CAPSULE BY MOUTH TWICE DAILY TO PREVENT UTI   divalproex 125 MG capsule Commonly known as:  DEPAKOTE SPRINKLE Take 250 mg by mouth 2 (two) times daily.   donepezil 10 MG tablet Commonly known as:  ARICEPT TAKE 1 TABLET BY MOUTH ONCE DAILY FOR MEMORY   GNP VITAMIN D SUPER STRENGTH 5000 units Tabs Generic drug:  Cholecalciferol TAKE 1 TABLET BY MOUTH ONCE DAILY FOR SUPPLEMENT   latanoprost 0.005 % ophthalmic solution Commonly known as:  XALATAN Place 1 drop into both eyes at bedtime.  loperamide 2 MG tablet Commonly known as:  IMODIUM A-D Take 2 mg by mouth 4 (four) times daily as needed for diarrhea or loose stools.   magnesium hydroxide 400 MG/5ML suspension Commonly known as:  MILK OF MAGNESIA Take 30 mLs by mouth daily as needed for mild constipation.   multivitamin with minerals Tabs tablet Take 1 tablet by mouth daily.   PROTEIN SUPPLEMENT 80% PO Take 90 mLs by mouth 2 (two) times daily.       Review of Systems  Unable to perform ROS: Patient nonverbal    Immunization History  Administered Date(s) Administered  . Influenza Whole 12/29/2013  . Influenza-Unspecified 11/27/2014  . PPD Test 09/14/2015  . Td 01/11/2015   Pertinent  Health Maintenance Due    Topic Date Due  . DEXA SCAN  06/17/1988  . PNA vac Low Risk Adult (1 of 2 - PCV13) 06/17/1988  . INFLUENZA VACCINE  11/19/2015 (Originally 09/18/2015)   No flowsheet data found. Functional Status Survey:    Vitals:   09/19/15 1545  BP: 130/76  Pulse: 76  Resp: 18  Temp: 97.7 F (36.5 C)  SpO2: 94%  Weight: 103 lb (46.7 kg)  Height:  (1.549 m)   Body mass index is 19.46 kg/m. Physical Exam  Constitutional:  Thin frail Elderly in non-verbal in no acute distress. Makes good eye contact.   HENT:  Head: Normocephalic.  Mouth/Throat: Oropharynx is clear and moist. No oropharyngeal exudate.  Eyes: Conjunctivae and EOM are normal. Pupils are equal, round, and reactive to light. Right eye exhibits no discharge. Left eye exhibits no discharge. No scleral icterus.  Neck: Normal range of motion. No JVD present. No thyromegaly present.  Cardiovascular: Normal rate, regular rhythm, normal heart sounds and intact distal pulses.  Exam reveals no gallop and no friction rub.   No murmur heard. Pulmonary/Chest: Effort normal and breath sounds normal. No respiratory distress. She has no wheezes. She has no rales.  Abdominal: Soft. Bowel sounds are normal. She exhibits no distension. There is no tenderness. There is no rebound and no guarding.  Genitourinary:  Genitourinary Comments: Incontinent   Musculoskeletal: She exhibits no edema, tenderness or deformity.  Lymphadenopathy:    She has no cervical adenopathy.  Neurological: She is alert.  Non-verbal   Skin: Skin is warm and dry. No rash noted. No erythema. No pallor.  Psychiatric:  Lying quietly in bed but non-verbal.     Labs reviewed:  Recent Labs  08/06/15 1849 08/19/15 0742 08/20/15 0308  NA 138 139 139  K 4.5 4.4 4.0  CL 100* 102 105  CO2 GLUCOSE 102* 110* 82  BUN CREATININE 0.86 0.67 0.66  CALCIUM 9.4 9.0 8.7*    Recent Labs  12/24/14 1324 05/21/15 1028 08/06/15 1849  AST 52* 40 47*   ALT 47 38* 37  ALKPHOS 98 78 66  BILITOT 0.3 <0.2 0.4  PROT 7.3 6.3 6.4*  ALBUMIN 3.0* 3.3 2.9*    Recent Labs  10/02/14 2032  05/21/15 1028 08/06/15 1849 08/19/15 0742 08/20/15 0308  WBC 5.8  < > 6.1 6.5 8.0 5.8  NEUTROABS 3.5  --  3.8 4.0  --   --   HGB 11.3*  < >  --  12.1 12.1 11.0*  HCT 35.3  < > 37.1 37.8 35.9 32.4*  MCV 85.3  < > 89 88.7 86.5 86.2  PLT 202  < > 283 213 191 179  < > =  values in this interval not displayed. Lab Results  Component Value Date   TSH 2.390 05/21/2015   Lab Results  Component Value Date   HGBA1C 5.9 08/19/2015   Lab Results  Component Value Date   CHOL 176 08/21/2015   HDL 54 08/21/2015   LDLCALC 113 (H) 08/21/2015   TRIG 46 08/21/2015   CHOLHDL 3.3 08/21/2015    Significant Diagnostic Results in last 30 days:  Ct Head Wo Contrast  Result Date: 08/20/2015 CLINICAL DATA:  Recent left pontine infarct.  Dementia. EXAM: CT HEAD WITHOUT CONTRAST TECHNIQUE: Contiguous axial images were obtained from the base of the skull through the vertex without intravenous contrast. COMPARISON:  Head CT and brain MRI August 19, 2015 FINDINGS: Brain: Mild diffuse atrophy is stable. There is no intracranial mass hemorrhage, extra-axial fluid collection, or midline shift. The known acute infarct in the left medial pons is subtly present on axial slice 8 series 6. It does not appear to have progressed compared to 1 day prior, although direct comparison with the MR is difficult. There is evidence of a prior infarct near the right frontal -parietal junction, stable. There is widespread small vessel disease throughout the centra semiovale bilaterally. These changes are stable compared to 1 day prior. No new infarct is seen compared to 1 day prior. Vascular: There is calcification in the cavernous carotid artery regions bilaterally. Skull: The bony calvarium appears intact. Sinuses/Orbits: Visualized paranasal sinuses are clear. Visualized orbits are symmetric  bilaterally. Other: Mastoid air cells are clear. IMPRESSION: Atrophy with extensive supratentorial small vessel disease. Prior infarct at the right frontal-parietal junction. Subtle medial left pontine infarct, better seen on MR. No new infarct evident compared to 1 day prior. There are areas of vascular calcification in the cavernous carotid arteries. Electronically Signed   By: Bretta Bang III M.D.   On: 08/20/2015 18:13    Assessment/Plan 1. Elevated LFTs  AST 54, ALT 75 ( 09/18/2015) Will decrease Atorvastatin to 20 mg Tablet daily at 6 PM. D/C Tylenol. Recheck CMP 09/25/2015. Lipid panel in 3 months.   2. Dyslipidemia decrease Atorvastatin to 20 mg Tablet daily at 6 PM due to elevated liver enzymes.  Lipid panel in 3 months.  3. Malnutrition (HCC) TP 5.9, Alb 2.83 ( 09/18/2015). Continue on Med pass 90 mls twice daily and Decubivite daily. Monitor BMP. Continue to assist with feeding.    Family/ staff Communication: Reviewed plan of care with facility Nurse supervisor.   Labs/tests ordered: CMP 09/25/2015.Lipid panel in 3 months

## 2015-09-25 LAB — HEPATIC FUNCTION PANEL
ALK PHOS: 79 U/L (ref 25–125)
ALT: 86 U/L — AB (ref 7–35)
AST: 59 U/L — AB (ref 13–35)
Bilirubin, Total: 0.3 mg/dL

## 2015-09-25 LAB — BASIC METABOLIC PANEL
BUN: 12 mg/dL (ref 4–21)
Creatinine: 0.4 mg/dL — AB (ref 0.5–1.1)
GLUCOSE: 98 mg/dL
POTASSIUM: 4.1 mmol/L (ref 3.4–5.3)
SODIUM: 141 mmol/L (ref 137–147)

## 2015-10-16 ENCOUNTER — Non-Acute Institutional Stay (SKILLED_NURSING_FACILITY): Payer: Medicare Other | Admitting: Family

## 2015-10-16 DIAGNOSIS — R569 Unspecified convulsions: Secondary | ICD-10-CM

## 2015-10-16 DIAGNOSIS — F0390 Unspecified dementia without behavioral disturbance: Secondary | ICD-10-CM | POA: Diagnosis not present

## 2015-10-16 DIAGNOSIS — I129 Hypertensive chronic kidney disease with stage 1 through stage 4 chronic kidney disease, or unspecified chronic kidney disease: Secondary | ICD-10-CM

## 2015-10-16 DIAGNOSIS — E785 Hyperlipidemia, unspecified: Secondary | ICD-10-CM | POA: Diagnosis not present

## 2015-10-16 DIAGNOSIS — R945 Abnormal results of liver function studies: Secondary | ICD-10-CM

## 2015-10-16 DIAGNOSIS — R7989 Other specified abnormal findings of blood chemistry: Secondary | ICD-10-CM

## 2015-10-16 NOTE — Progress Notes (Signed)
Location:   [facility]  Nursing Home Room Number: 804 A  Place of Service:  SNF (31) Provider: Rodarius Kichline FNP-C   Olevia PerchesMegan Johnson, DO  Patient Care Team: Dorcas CarrowMegan P Johnson, DO as PCP - General (Family Medicine)  Extended Emergency Contact Information Primary Emergency Contact: Sturdivant,Deloris Address: [address]          [place], [place] [address] [place] Home Phone: [phone] Mobile Phone: [phone] Relation: Daughter Secondary Emergency Contact: Ross,Jacqueline Address: [phone]          [place], [place] [address] [place] Home Phone: [phone] Relation: Daughter  Code Status: DNR  Goals of care: Advanced Directive information Advanced Directives 09/17/2015  Does patient have an advance directive? Yes  Type of Advance Directive Out of facility DNR (pink MOST or yellow form)  Does patient want to make changes to advanced directive? No - Patient declined  Copy of advanced directive(s) in chart? Yes  Would patient like information on creating an advanced directive? -     Chief Complaint  Patient presents with  . Medical Management of Chronic Issues    HPI:  Pt is a 82 y.o. female seen today at [facility] for medical management of chronic diseases.She has a medical history of Dementia, CVA, seizures among other conditions. She is seen in her room today lying in the bed. She has good eye contact but non-verbal during the visit unable to obtain HPI and ROS. Facility staff reports no new concerns.Shre has had a 5 pound weight loss over one month. She continues to follow up with RD current on protein supplements with assist with all meals. She was seen by Speech therapist but light potential achieved.    Past Medical History:  Diagnosis Date  . Alzheimer disease   . Arthritis   . Frequent falls   . Hypertension   . Kidney disease   . Seizure [facility](HCC)    Past Surgical History:    Procedure Laterality Date  . TUBAL LIGATION      No Known Allergies    Medication List       Accurate as of 10/16/15  5:27 PM. Always use your most recent med list.          acetaminophen 500 MG tablet Commonly known as:  TYLENOL Take 500 mg by mouth every 6 (six) hours as needed for mild pain or headache.   alum & mag hydroxide-simeth 200-200-20 MG/5ML suspension Commonly known as:  MAALOX/MYLANTA Take 30 mLs by mouth as needed for indigestion or heartburn.   amLODipine 2.5 MG tablet Commonly known as:  NORVASC Take 1 tablet (2.5 mg total) by mouth daily.   atorvastatin 40 MG tablet Commonly known as:  LIPITOR Take 0.5 tablets (20 mg total) by mouth daily at 6 PM.   bismuth subsalicylate 262 MG/15ML suspension Commonly known as:  PEPTO BISMOL Take 30 mLs by mouth every 6 (six) hours as needed (for GI upset).   clopidogrel 75 MG tablet Commonly known as:  PLAVIX Take 1 tablet (75 mg total) by mouth daily.   Cranberry Fruit 405 MG Caps TAKE 1 CAPSULE BY MOUTH TWICE DAILY TO PREVENT UTI   divalproex 125 MG capsule Commonly known as:  DEPAKOTE SPRINKLE Take 250 mg by mouth 2 (two) times daily.   donepezil 10 MG tablet Commonly known as:  ARICEPT TAKE 1 TABLET BY MOUTH ONCE DAILY FOR MEMORY   GNP VITAMIN D  SUPER STRENGTH 5000 units Tabs Generic drug:  Cholecalciferol TAKE 1 TABLET BY MOUTH ONCE DAILY FOR SUPPLEMENT   latanoprost 0.005 % ophthalmic solution Commonly known as:  XALATAN Place 1 drop into both eyes at bedtime.   loperamide 2 MG tablet Commonly known as:  IMODIUM A-D Take 2 mg by mouth 4 (four) times daily as needed for diarrhea or loose stools.   magnesium hydroxide 400 MG/5ML suspension Commonly known as:  MILK OF MAGNESIA Take 30 mLs by mouth daily as needed for mild constipation.   multivitamin with minerals Tabs tablet Take 1 tablet by mouth daily.   PROTEIN SUPPLEMENT 80% PO Take 90 mLs by mouth 2 (two) times daily.        Review of Systems  Unable to perform ROS: Patient nonverbal    Immunization History  Administered Date(s) Administered  . Influenza Whole 12/29/2013  . Influenza-Unspecified 11/27/2014  . PPD Test 09/14/2015  . Td 01/11/2015   Pertinent  Health Maintenance Due  Topic Date Due  . DEXA SCAN  06/17/1988  . PNA vac Low Risk Adult (1 of 2 - PCV13) 06/17/1988  . INFLUENZA VACCINE  11/19/2015 (Originally 09/18/2015)   No flowsheet data found. Functional Status Survey:    Vitals:   10/16/15 1015  BP: 137/75  Pulse: 61  Resp: 18  Temp: 97.5 F (36.4 C)  SpO2: 96%  Weight: 97 lb 9.6 oz (44.3 kg)  Height: 5\' 1"  (1.549 m)   Body mass index is 18.44 kg/m. Physical Exam  Constitutional:  Thin frail Elderly in non-verbal in no acute distress. Makes good eye contact.   HENT:  Head: Normocephalic.  Mouth/Throat: Oropharynx is clear and moist. No oropharyngeal exudate.  Eyes: Conjunctivae and EOM are normal. Pupils are equal, round, and reactive to light. Right eye exhibits no discharge. Left eye exhibits no discharge. No scleral icterus.  Neck: Normal range of motion. No JVD present. No thyromegaly present.  Cardiovascular: Normal rate, regular rhythm, normal heart sounds and intact distal pulses.  Exam reveals no gallop and no friction rub.   No murmur heard. Pulmonary/Chest: Effort normal and breath sounds normal. No respiratory distress. She has no wheezes. She has no rales.  Abdominal: Soft. Bowel sounds are normal. She exhibits no distension. There is no tenderness. There is no rebound and no guarding.  LBM 10/16/2015  Genitourinary:  Genitourinary Comments: Incontinent   Musculoskeletal: She exhibits no edema, tenderness or deformity.  Lymphadenopathy:    She has no cervical adenopathy.  Neurological: She is alert.  Non-verbal. Good eye contact.   Skin: Skin is warm and dry. No rash noted. No erythema. No pallor.  Psychiatric: She has a normal mood and affect.     Labs reviewed:  Recent Labs  08/06/15 1849 08/19/15 0742 08/20/15 0308  NA 138 139 139  K 4.5 4.4 4.0  CL 100* 102 105  CO2 30 30 31   GLUCOSE 102* 110* 82  BUN 19 13 18   CREATININE 0.86 0.67 0.66  CALCIUM 9.4 9.0 8.7*    Recent Labs  12/24/14 1324 05/21/15 1028 08/06/15 1849  AST 52* 40 47*  ALT 47 38* 37  ALKPHOS 98 78 66  BILITOT 0.3 <0.2 0.4  PROT 7.3 6.3 6.4*  ALBUMIN 3.0* 3.3 2.9*    Recent Labs  05/21/15 1028 08/06/15 1849 08/19/15 0742 08/20/15 0308  WBC 6.1 6.5 8.0 5.8  NEUTROABS 3.8 4.0  --   --   HGB  --  12.1 12.1 11.0*  HCT  37.1 37.8 35.9 32.4*  MCV 89 88.7 86.5 86.2  PLT 283 213 191 179   Lab Results  Component Value Date   TSH 2.390 05/21/2015   Lab Results  Component Value Date   HGBA1C 5.9 08/19/2015   Lab Results  Component Value Date   CHOL 176 08/21/2015   HDL 54 08/21/2015   LDLCALC 113 (H) 08/21/2015   TRIG 46 08/21/2015   CHOLHDL 3.3 08/21/2015   Assessment/Plan 1. Benign hypertensive renal disease B/p log ranging in 130's/70's - 170's/70's. Increase Amlodipine to 5 mg Tablet daily. Monitor B/p twice daily X 1 week then resume previous orders.   2. Seizures (HCC) No recent episodes reported. Continue to monitor.   3. Elevated LFTs AST 59, ALT 86 ( 10/15/2015) previous AST 54, ALT 75 ( 09/18/2015).Discontinue Atorvastatin 20 mg Tablet.   4. Dyslipidemia Discontinue Atorvastatin 20 mg Tablet due elevated LFT's and advance age.     5. Dementia, without behavioral disturbance No new behavioral issues reported. Continue to assist with meals and ADL'S. No Skin breakdown.     Family/ staff Communication: Reviewed plan of care with facility Nurse supervisor.  Labs/tests ordered: CMP 10/23/2015

## 2015-10-23 LAB — HEPATIC FUNCTION PANEL
ALK PHOS: 78 U/L (ref 25–125)
ALT: 43 U/L — AB (ref 7–35)
AST: 36 U/L — AB (ref 13–35)
Bilirubin, Total: 0.3 mg/dL

## 2015-10-23 LAB — BASIC METABOLIC PANEL
BUN: 10 mg/dL (ref 4–21)
CREATININE: 0.3 mg/dL — AB (ref 0.5–1.1)
Glucose: 92 mg/dL
POTASSIUM: 4.6 mmol/L (ref 3.4–5.3)
SODIUM: 140 mmol/L (ref 137–147)

## 2015-11-05 ENCOUNTER — Encounter: Payer: Self-pay | Admitting: Family

## 2015-11-05 ENCOUNTER — Non-Acute Institutional Stay (SKILLED_NURSING_FACILITY): Payer: Medicare Other | Admitting: Family

## 2015-11-05 DIAGNOSIS — L89610 Pressure ulcer of right heel, unstageable: Secondary | ICD-10-CM | POA: Diagnosis not present

## 2015-11-05 DIAGNOSIS — L8990 Pressure ulcer of unspecified site, unspecified stage: Secondary | ICD-10-CM | POA: Diagnosis not present

## 2015-11-05 MED ORDER — ACETAMINOPHEN 500 MG PO TABS: 500.0000 mg | ORAL_TABLET | Freq: Two times a day (BID) | ORAL | 0 refills | Status: DC

## 2015-11-05 NOTE — Progress Notes (Signed)
Patient ID: Mary [name], female   DOB: 04-Feb-1922, 82 y.o.   MRN: [id number]   Location:  [facility] Room Number: 801-A Place of Service:  SNF [phone]) Provider:  Richarda Blade, FNP-C  Olevia Perches, DO  Patient Care Team: Dorcas Carrow, DO as PCP - General (Family Medicine)  Extended Emergency Contact Information Primary Emergency Contact: Sturdivant,Deloris Address: [address]          [place], [place] [address] [place] Home Phone: [phone] Mobile Phone: [phone] Relation: Daughter Secondary Emergency Contact: Ross,Jacqueline Address: [id number]          [place], [place] [address] [place] Home Phone: [phone] Relation: Daughter  Code Status:  DNR Goals of care: Advanced Directive information Advanced Directives 11/05/2015  Does patient have an advance directive? Yes  Type of Advance Directive Out of facility DNR (pink MOST or yellow form)  Does patient want to make changes to advanced directive? No - Patient declined  Copy of advanced directive(s) in chart? Yes  Would patient like information on creating an advanced directive? -     Chief Complaint  Patient presents with  . Acute Visit    Acute    HPI:  Pt is a 82 y.o. female seen today at [facility]  for an acute visit for evaluation of Pressure ulcer. She has a medical history of CVA, HTN, Dementia, Seizures among other conditions. She is seen in her room today per facility Nurse request. She opens her eyes to verbal command but non-verbal during visit.Facility wound care Nurse reports patient has new ulcer on lateral right leg. Also states right heel unstagable ulcer stable.Patient remains afebrile. Unable to provide HPI and ROS due to presence of dementia.    Past Medical History:  Diagnosis Date  . Alzheimer disease   . Arthritis   . Frequent falls   . Hypertension   . Kidney disease   . Seizure [facility])    Past  Surgical History:  Procedure Laterality Date  . TUBAL LIGATION      No Known Allergies    Medication List       Accurate as of 11/05/15 11:36 AM. Always use your most recent med list.          alum & mag hydroxide-simeth 200-200-20 MG/5ML suspension Commonly known as:  MAALOX/MYLANTA Take 30 mLs by mouth as needed for indigestion or heartburn.   amLODipine 5 MG tablet Commonly known as:  NORVASC Take 5 mg by mouth daily.   bismuth subsalicylate 262 MG/15ML suspension Commonly known as:  PEPTO BISMOL Take 30 mLs by mouth every 6 (six) hours as needed (for GI upset).   clopidogrel 75 MG tablet Commonly known as:  PLAVIX Take 1 tablet (75 mg total) by mouth daily.   Cranberry Fruit 405 MG Caps TAKE 1 CAPSULE BY MOUTH TWICE DAILY TO PREVENT UTI   divalproex 125 MG capsule Commonly known as:  DEPAKOTE SPRINKLE Take 250 mg by mouth 2 (two) times daily.   donepezil 10 MG tablet Commonly known as:  ARICEPT TAKE 1 TABLET BY MOUTH ONCE DAILY FOR MEMORY   GNP VITAMIN D SUPER STRENGTH 5000 units Tabs Generic drug:  Cholecalciferol TAKE 1 TABLET BY MOUTH ONCE DAILY FOR SUPPLEMENT   latanoprost 0.005 % ophthalmic solution Commonly known as:  XALATAN Place 1 drop into both eyes at bedtime.   loperamide 2 MG tablet Commonly known as:  IMODIUM A-D  Take 2 mg by mouth 4 (four) times daily as needed for diarrhea or loose stools.   magnesium hydroxide 400 MG/5ML suspension Commonly known as:  MILK OF MAGNESIA Take 30 mLs by mouth daily as needed for mild constipation.   multivitamin with minerals Tabs tablet Take 1 tablet by mouth daily.   PROSTATE PO Take 30 mLs by mouth daily.   PROTEIN SUPPLEMENT 80% PO Take 90 mLs by mouth 2 (two) times daily.       Review of Systems  Unable to perform ROS: Patient nonverbal    Immunization History  Administered Date(s) Administered  . Influenza Whole 12/29/2013  . Influenza-Unspecified 11/27/2014  . PPD Test 09/14/2015    . Td 01/11/2015   Pertinent  Health Maintenance Due  Topic Date Due  . DEXA SCAN  06/17/1988  . PNA vac Low Risk Adult (1 of 2 - PCV13) 06/17/1988  . INFLUENZA VACCINE  11/19/2015 (Originally 09/18/2015)       Vitals:   11/05/15 1120  BP: 129/90  Pulse: 80  Temp: 98.5 F (36.9 C)  TempSrc: Oral  SpO2: 97%  Weight: 99 lb 6 oz (45.1 kg)  Height: 5\' 1"  (1.549 m)   Body mass index is 18.78 kg/m. Physical Exam  Constitutional:  Thin frail Elderly in non-verbal in no acute distress. Opens eyes to verbal command.   HENT:  Head: Normocephalic.  Mouth/Throat: Oropharynx is clear and moist. No oropharyngeal exudate.  Eyes: Conjunctivae and EOM are normal. Pupils are equal, round, and reactive to light. Right eye exhibits no discharge. Left eye exhibits no discharge. No scleral icterus.  Neck: Normal range of motion. No JVD present. No thyromegaly present.  Cardiovascular: Normal rate, regular rhythm, normal heart sounds and intact distal pulses.  Exam reveals no gallop and no friction rub.   No murmur heard. Pulmonary/Chest: Effort normal and breath sounds normal. No respiratory distress. She has no wheezes. She has no rales.  Abdominal: Soft. Bowel sounds are normal. She exhibits no distension. There is no tenderness. There is no rebound and no guarding.  Genitourinary:  Genitourinary Comments: Incontinent   Musculoskeletal: She exhibits no edema, tenderness or deformity.  Wheelchair/bed bound   Lymphadenopathy:    She has no cervical adenopathy.  Neurological: She is alert.  Non-verbal.  Skin: Skin is warm and dry. No rash noted. No erythema. No pallor.  # 1. Right heel:  unstageable ulcer covered with black eschar no drainage noted. Surrounding skin tissue without any signs of infections. # 2. Right lateral leg: wound bed yellow without any drainage noted.  # 3. Sacral ulcer: small less than dime size, wound bed red without any drainage or odor noted.   Psychiatric: She has  a normal mood and affect.    Labs reviewed:  Recent Labs  08/06/15 1849 08/19/15 0742 08/20/15 0308  NA 138 139 139  K 4.5 4.4 4.0  CL 100* 102 105  CO2 30 30 31   GLUCOSE 102* 110* 82  BUN 19 13 18   CREATININE 0.86 0.67 0.66  CALCIUM 9.4 9.0 8.7*    Recent Labs  12/24/14 1324 05/21/15 1028 08/06/15 1849  AST 52* 40 47*  ALT 47 38* 37  ALKPHOS 98 78 66  BILITOT 0.3 <0.2 0.4  PROT 7.3 6.3 6.4*  ALBUMIN 3.0* 3.3 2.9*    Recent Labs  05/21/15 1028 08/06/15 1849 08/19/15 0742 08/20/15 0308  WBC 6.1 6.5 8.0 5.8  NEUTROABS 3.8 4.0  --   --   HGB  --  12.1 12.1 11.0*  HCT 37.1 37.8 35.9 32.4*  MCV 89 88.7 86.5 86.2  PLT 283 213 191 179   Lab Results  Component Value Date   TSH 2.390 05/21/2015   Lab Results  Component Value Date   HGBA1C 5.9 08/19/2015   Lab Results  Component Value Date   CHOL 176 08/21/2015   HDL 54 08/21/2015   LDLCALC 113 (H) 08/21/2015   TRIG 46 08/21/2015   CHOLHDL 3.3 08/21/2015   Assessment/Plan Right heel ulcer Unstageable. Wound bed covered with dark eschar. Continue current wound care. Provolone boots.   Right lateral leg Ulcer Wound bed with yellow tissue without any odor or drainage. Obtain Right leg ABI's to rule out statis ulcer.   Family/ staff Communication: Reviewed plan of care with patient's wound Care Nurse and facility Nurse supervisor.  Labs/tests ordered:  Right leg ABI's

## 2015-11-15 ENCOUNTER — Encounter: Payer: Self-pay | Admitting: Family

## 2015-11-15 ENCOUNTER — Non-Acute Institutional Stay (SKILLED_NURSING_FACILITY): Payer: Medicare Other | Admitting: Family

## 2015-11-15 DIAGNOSIS — F0391 Unspecified dementia with behavioral disturbance: Secondary | ICD-10-CM | POA: Diagnosis not present

## 2015-11-15 DIAGNOSIS — L89890 Pressure ulcer of other site, unstageable: Secondary | ICD-10-CM | POA: Diagnosis not present

## 2015-11-15 DIAGNOSIS — E46 Unspecified protein-calorie malnutrition: Secondary | ICD-10-CM

## 2015-11-15 DIAGNOSIS — I129 Hypertensive chronic kidney disease with stage 1 through stage 4 chronic kidney disease, or unspecified chronic kidney disease: Secondary | ICD-10-CM | POA: Diagnosis not present

## 2015-11-15 DIAGNOSIS — R569 Unspecified convulsions: Secondary | ICD-10-CM

## 2015-11-15 NOTE — Progress Notes (Signed)
Location:  [facility] Room Number: [id number] Place of Service:  SNF (31) Provider:  Richarda Bladeinah Sreekar Broyhill, FNP-C  Olevia PerchesMegan Johnson, DO  Patient Care Team: Dorcas CarrowMegan P Johnson, DO as PCP - General (Family Medicine)  Extended Emergency Contact Information Primary Emergency Contact: Sturdivant,Deloris Address: [address]          [place], [place] [address] [place] Home Phone: [phone] Mobile Phone: [phone] Relation: Daughter Secondary Emergency Contact: Ross,Jacqueline Address: [address]          [place], [place] [address] [place] Home Phone: [phone] Relation: Daughter  Code Status:  DNR Goals of care: Advanced Directive information Advanced Directives 11/15/2015  Does patient have an advance directive? Yes  Type of Advance Directive -  Does patient want to make changes to advanced directive? -  Copy of advanced directive(s) in chart? -  Would patient like information on creating an advanced directive? -  Pre-existing out of facility DNR order (yellow form or pink MOST form) Yellow form placed in chart (order not valid for inpatient use)     Chief Complaint  Patient presents with  . Medical Management of Chronic Issues    routine    HPI:  Pt is a 82 y.o. female [facility]. She has a medical history of Dementia, CVA, HTN, Seizures among other conditions. She is seen in her room today. She is non-verbal and does not follow any commands. Left leg ulcer continues to be managed by wound care Nurse. Facility staff reports patient fell out of her wheelchair 11/13/2015 few minutes upon being transferred to wheelchair unwitnessed. No injuries sustained.    Past Medical History:  Diagnosis Date  . Alzheimer disease   . Arthritis   . Frequent falls   . Hypertension   . Kidney disease   . Seizure [facility](HCC)    Past Surgical History:  Procedure  Laterality Date  . TUBAL LIGATION      No Known Allergies    Medication List       Accurate as of 11/15/15 11:12 AM. Always use your most recent med list.          acetaminophen 500 MG tablet Commonly known as:  TYLENOL Take 1 tablet (500 mg total) by mouth 2 (two) times daily.   alum & mag hydroxide-simeth 200-200-20 MG/5ML suspension Commonly known as:  MAALOX/MYLANTA Take 30 mLs by mouth as needed for indigestion or heartburn.   amLODipine 5 MG tablet Commonly known as:  NORVASC Take 5 mg by mouth daily.   bismuth subsalicylate 262 MG/15ML suspension Commonly known as:  PEPTO BISMOL Take 30 mLs by mouth every 6 (six) hours as needed (for GI upset).   clopidogrel 75 MG tablet Commonly known as:  PLAVIX Take 1 tablet (75 mg total) by mouth daily.   Cranberry Fruit 405 MG Caps TAKE 1 CAPSULE BY MOUTH TWICE DAILY TO PREVENT UTI   divalproex 125 MG capsule Commonly known as:  DEPAKOTE SPRINKLE Take 250 mg by mouth 2 (two) times daily.   donepezil 10 MG tablet Commonly known as:  ARICEPT TAKE 1 TABLET BY MOUTH ONCE DAILY FOR MEMORY   GNP VITAMIN D SUPER STRENGTH 5000 units Tabs Generic drug:  Cholecalciferol TAKE 1 TABLET BY MOUTH ONCE DAILY FOR SUPPLEMENT   latanoprost 0.005 % ophthalmic solution Commonly known as:  XALATAN Place 1 drop into both eyes at bedtime.  loperamide 2 MG tablet Commonly known as:  IMODIUM A-D Take 2 mg by mouth 4 (four) times daily as needed for diarrhea or loose stools.   magnesium hydroxide 400 MG/5ML suspension Commonly known as:  MILK OF MAGNESIA Take 30 mLs by mouth daily as needed for mild constipation.   multivitamin with minerals Tabs tablet Take 1 tablet by mouth daily.   PROSTATE PO Take 30 mLs by mouth daily.   PROTEIN SUPPLEMENT 80% PO Take 90 mLs by mouth 2 (two) times daily.       Review of Systems  Unable to perform ROS: Patient nonverbal    Immunization History  Administered Date(s) Administered  .  Influenza Whole 12/29/2013  . Influenza-Unspecified 11/27/2014  . PPD Test 09/14/2015  . Td 01/11/2015   Pertinent  Health Maintenance Due  Topic Date Due  . DEXA SCAN  06/17/1988  . PNA vac Low Risk Adult (1 of 2 - PCV13) 06/17/1988  . INFLUENZA VACCINE  11/19/2015 (Originally 09/18/2015)   No flowsheet data found. Functional Status Survey:    Vitals:   11/15/15 1058  BP: (!) 154/80  Pulse: 80  Resp: 18  Temp: 97.4 F (36.3 C)  SpO2: 95%  Weight: 99 lb 9.6 oz (45.2 kg)  Height: 5\' 1"  (1.549 m)   Body mass index is 18.82 kg/m. Physical Exam  Constitutional:  Thin frail Elderly non-verbal in no acute distress. Opens eyes but does not follow any command.   HENT:  Head: Normocephalic.  Mouth/Throat: Oropharynx is clear and moist. No oropharyngeal exudate.  Eyes: Conjunctivae and EOM are normal. Pupils are equal, round, and reactive to light. Right eye exhibits no discharge. Left eye exhibits no discharge. No scleral icterus.  Neck: Normal range of motion. No JVD present. No thyromegaly present.  Cardiovascular: Normal rate, regular rhythm, normal heart sounds and intact distal pulses.  Exam reveals no gallop and no friction rub.   No murmur heard. Pulmonary/Chest: Effort normal and breath sounds normal. No respiratory distress. She has no wheezes. She has no rales.  Abdominal: Soft. Bowel sounds are normal. She exhibits no distension. There is no tenderness. There is no rebound and no guarding.  Genitourinary:  Genitourinary Comments: Incontinent for both bowel and bladder  Musculoskeletal: She exhibits no edema, tenderness or deformity.  Wheelchair/bed bound.   Lymphadenopathy:    She has no cervical adenopathy.  Neurological: She is alert.  Non-verbal.  Skin: Skin is warm and dry. No rash noted. No erythema. No pallor.  # 1. Right heel:  unstageable ulcer covered with black eschar no drainage noted. Surrounding skin tissue without any signs of infections. # 2. Right  lateral leg: wound bed yellow with beefy redness on the edges noted.No drainage noted.    Psychiatric: She has a normal mood and affect.    Labs reviewed:  Recent Labs  08/06/15 1849 08/19/15 0742 08/20/15 0308  NA 138 139 139  K 4.5 4.4 4.0  CL 100* 102 105  CO2 30 30 31   GLUCOSE 102* 110* 82  BUN 19 13 18   CREATININE 0.86 0.67 0.66  CALCIUM 9.4 9.0 8.7*    Recent Labs  12/24/14 1324 05/21/15 1028 08/06/15 1849  AST 52* 40 47*  ALT 47 38* 37  ALKPHOS 98 78 66  BILITOT 0.3 <0.2 0.4  PROT 7.3 6.3 6.4*  ALBUMIN 3.0* 3.3 2.9*    Recent Labs  05/21/15 1028 08/06/15 1849 08/19/15 0742 08/20/15 0308  WBC 6.1 6.5 8.0 5.8  NEUTROABS 3.8  4.0  --   --   HGB  --  12.1 12.1 11.0*  HCT 37.1 37.8 35.9 32.4*  MCV 89 88.7 86.5 86.2  PLT 283 213 191 179   Lab Results  Component Value Date   TSH 2.390 05/21/2015   Lab Results  Component Value Date   HGBA1C 5.9 08/19/2015   Lab Results  Component Value Date   CHOL 176 08/21/2015   HDL 54 08/21/2015   LDLCALC 113 (H) 08/21/2015   TRIG 46 08/21/2015   CHOLHDL 3.3 08/21/2015    Assessment/Plan HTN B/p stable. Continue to monitor. Monitor BMP   Seizures No recent seizure activities reported.continue current medication. Fall and safety precautions.   Protein Malnutrition Continue to encourage oral intake. Monitor BMP.   Dementia No new behavioral issues reported.continue on Aricept. Continue to assist with ADL's. Encourage oral intake and hydration.Skin care.  Pressure ulcer Left lateral leg Slight improvement on the edges but most of the wound bed covered with yellow tissue. Continue with wound care. Continue protein supplements to promote wound healing.   Family/ staff Communication: Reviewed plan of care with facility Nurse supervisor.   Labs/tests ordered:  None

## 2015-11-19 LAB — LIPID PANEL
Cholesterol: 129 mg/dL (ref 0–200)
HDL: 42 mg/dL (ref 35–70)
LDL CALC: 75 mg/dL
Triglycerides: 61 mg/dL (ref 40–160)

## 2015-11-22 ENCOUNTER — Encounter: Payer: Self-pay | Admitting: Family

## 2015-11-22 ENCOUNTER — Non-Acute Institutional Stay (SKILLED_NURSING_FACILITY): Payer: Medicare Other | Admitting: Family

## 2015-11-22 DIAGNOSIS — N3000 Acute cystitis without hematuria: Secondary | ICD-10-CM | POA: Diagnosis not present

## 2015-11-22 NOTE — Progress Notes (Signed)
Location:  [facility] Room Number: [id number] Place of Service:  SNF (31) Provider: Pasty Manninen FNP-C   Olevia Perches, DO  Patient Care Team: Dorcas Carrow, DO as PCP - General (Family Medicine)  Extended Emergency Contact Information Primary Emergency Contact: Sturdivant,Deloris Address: [address]          [place], [place] [address] [place] Home Phone: [phone] Mobile Phone: [phone] Relation: Daughter Secondary Emergency Contact: Ross,Jacqueline Address: [phone]          [place], [place] [address] [place] Home Phone: [phone] Relation: Daughter  Code Status:  DNR  Goals of care: Advanced Directive information Advanced Directives 11/22/2015  Does patient have an advance directive? Yes  Type of Advance Directive Out of facility DNR (pink MOST or yellow form)  Does patient want to make changes to advanced directive? -  Copy of advanced directive(s) in chart? Yes  Would patient like information on creating an advanced directive? -  Pre-existing out of facility DNR order (yellow form or pink MOST form) -     Chief Complaint  Patient presents with  . Acute Visit    UTI    HPI:  Pt is a 82 y.o. female seen today at [facility] for an acute visit for evaluation of abnormal lab results. She is seen in her room today. She unable to provide ROS and HPI. Her recent U/A and C/S results showed Nitrite positive, greater than 100,000 units of E.Coli with ESBL negative. Facility staff reports no new concerns.    Past Medical History:  Diagnosis Date  . Alzheimer disease   . Arthritis   . Frequent falls   . Hypertension   . Kidney disease   . Seizure [facility])    Past Surgical History:  Procedure Laterality Date  . TUBAL LIGATION      No Known Allergies    Medication List       Accurate as of 11/22/15  1:47 PM. Always use your most recent med list.            acetaminophen 500 MG tablet Commonly known as:  TYLENOL Take 1 tablet (500 mg total) by mouth 2 (two) times daily.   alum & mag hydroxide-simeth 200-200-20 MG/5ML suspension Commonly known as:  MAALOX/MYLANTA Take 30 mLs by mouth as needed for indigestion or heartburn.   amLODipine 5 MG tablet Commonly known as:  NORVASC Take 5 mg by mouth daily.   bismuth subsalicylate 262 MG/15ML suspension Commonly known as:  PEPTO BISMOL Take 30 mLs by mouth every 6 (six) hours as needed (for GI upset).   ciprofloxacin 500 MG tablet Commonly known as:  CIPRO Take 500 mg by mouth 2 (two) times daily. Stop date: 11/29/15   clopidogrel 75 MG tablet Commonly known as:  PLAVIX Take 1 tablet (75 mg total) by mouth daily.   Cranberry Fruit 405 MG Caps TAKE 1 CAPSULE BY MOUTH TWICE DAILY TO PREVENT UTI   divalproex 125 MG capsule Commonly known as:  DEPAKOTE SPRINKLE Take 250 mg by mouth 2 (two) times daily.   donepezil 10 MG tablet Commonly known as:  ARICEPT TAKE 1 TABLET BY MOUTH ONCE DAILY FOR MEMORY   GNP VITAMIN D SUPER STRENGTH 5000 units Tabs Generic drug:  Cholecalciferol TAKE 1 TABLET BY MOUTH ONCE DAILY FOR SUPPLEMENT   latanoprost 0.005 % ophthalmic solution Commonly known as:  XALATAN Place 1 drop into both  eyes at bedtime.   loperamide 2 MG tablet Commonly known as:  IMODIUM A-D Take 2 mg by mouth 4 (four) times daily as needed for diarrhea or loose stools.   magnesium hydroxide 400 MG/5ML suspension Commonly known as:  MILK OF MAGNESIA Take 30 mLs by mouth daily as needed for mild constipation.   multivitamin with minerals Tabs tablet Take 1 tablet by mouth daily.   PROTEIN SUPPLEMENT 80% PO Take 90 mLs by mouth 2 (two) times daily.   saccharomyces boulardii 250 MG capsule Commonly known as:  FLORASTOR Take 250 mg by mouth 2 (two) times daily. Stop date: 12/02/15       Review of Systems  Unable to perform ROS: Patient nonverbal    Immunization History   Administered Date(s) Administered  . Influenza Whole 12/29/2013  . Influenza-Unspecified 11/27/2014  . PPD Test 09/14/2015, 09/28/2015  . Td 01/11/2015   Pertinent  Health Maintenance Due  Topic Date Due  . DEXA SCAN  06/17/1988  . PNA vac Low Risk Adult (1 of 2 - PCV13) 06/17/1988  . INFLUENZA VACCINE  09/18/2015      Vitals:   11/22/15 1330  BP: (!) 154/80  Pulse: 95  Resp: 18  Temp: 97.4 F (36.3 C)  Weight: 99 lb (44.9 kg)  Height: 5\' 1"  (1.549 m)   Body mass index is 18.71 kg/m. Physical Exam  Constitutional:  Thin frail Elderly non-verbal in no acute distress.  HENT:  Head: Normocephalic.  Mouth/Throat: Oropharynx is clear and moist. No oropharyngeal exudate.  Eyes: Conjunctivae and EOM are normal. Pupils are equal, round, and reactive to light. Right eye exhibits no discharge. Left eye exhibits no discharge. No scleral icterus.  Neck: Normal range of motion. No JVD present. No thyromegaly present.  Cardiovascular: Normal rate, regular rhythm, normal heart sounds and intact distal pulses.  Exam reveals no gallop and no friction rub.   No murmur heard. Pulmonary/Chest: Effort normal and breath sounds normal. No respiratory distress. She has no wheezes. She has no rales.  Abdominal: Soft. Bowel sounds are normal. She exhibits no distension. There is no tenderness. There is no rebound and no guarding.  Genitourinary:  Genitourinary Comments: Incontinent for both bowel and bladder  Musculoskeletal: She exhibits no edema, tenderness or deformity.  Wheelchair/bed bound.   Lymphadenopathy:    She has no cervical adenopathy.  Neurological: She is alert.  Non-verbal during the visit.   Skin: Skin is warm and dry. No rash noted. No erythema. No pallor.  # 1. Right heel:  unstageable ulcer covered with black eschar no drainage noted. Surrounding skin tissue without any signs of infections. # 2. Right lateral leg: wound bed yellow with beefy redness on the edges noted.No  drainage noted.    Psychiatric: She has a normal mood and affect.    Labs reviewed:  Recent Labs  08/06/15 1849 08/19/15 0742 08/20/15 0308 09/18/15 09/25/15 10/23/15  NA 138 139 139 142 141 140  K 4.5 4.4 4.0 4.4 4.1 4.6  CL 100* 102 105  --   --   --   CO2 30 30 31   --   --   --   GLUCOSE 102* 110* 82  --   --   --   BUN 19 13 18 14 12 10   CREATININE 0.86 0.67 0.66 0.5 0.4* 0.3*  CALCIUM 9.4 9.0 8.7*  --   --   --     Recent Labs  12/24/14 1324 05/21/15 1028 08/06/15 1849 09/18/15  09/25/15 10/23/15  AST 52* 40 47* 54* 59* 36*  ALT 47 38* 37 75* 86* 43*  ALKPHOS 98 78 66 86 79 78  BILITOT 0.3 <0.2 0.4  --   --   --   PROT 7.3 6.3 6.4*  --   --   --   ALBUMIN 3.0* 3.3 2.9*  --   --   --     Recent Labs  05/21/15 1028 08/06/15 1849 08/19/15 0742 08/20/15 0308 09/18/15  WBC 6.1 6.5 8.0 5.8 5.5  NEUTROABS 3.8 4.0  --   --   --   HGB  --  12.1 12.1 11.0* 13.0  HCT 37.1 37.8 35.9 32.4* 41  MCV 89 88.7 86.5 86.2  --   PLT 283 213 191 179 242   Lab Results  Component Value Date   TSH 2.390 05/21/2015   Lab Results  Component Value Date   HGBA1C 5.9 08/19/2015   Lab Results  Component Value Date   CHOL 176 08/21/2015   HDL 54 08/21/2015   LDLCALC 113 (H) 08/21/2015   TRIG 46 08/21/2015   CHOLHDL 3.3 08/21/2015   Assessment/Plan Urinary Tract Infections U/A and C/S results showed Nitrite positive, greater than 100,000 unitis of E.Coli with ESBL negative.Start cipro 500 mg tablet by mouth twice daily X 7 days and Florastor 250 mg Capsule one by mouth twice daily x 10 days.Encourage fluid intake.    Family/ staff Communication: Reviewed plan of care with facility Nurse Supervisor.   Labs/tests ordered:  None

## 2015-12-11 ENCOUNTER — Non-Acute Institutional Stay (SKILLED_NURSING_FACILITY): Payer: Medicare Other | Admitting: Family

## 2015-12-11 DIAGNOSIS — R1312 Dysphagia, oropharyngeal phase: Secondary | ICD-10-CM | POA: Diagnosis not present

## 2015-12-11 DIAGNOSIS — I129 Hypertensive chronic kidney disease with stage 1 through stage 4 chronic kidney disease, or unspecified chronic kidney disease: Secondary | ICD-10-CM | POA: Diagnosis not present

## 2015-12-11 DIAGNOSIS — E441 Mild protein-calorie malnutrition: Secondary | ICD-10-CM | POA: Diagnosis not present

## 2015-12-11 DIAGNOSIS — R531 Weakness: Secondary | ICD-10-CM | POA: Diagnosis not present

## 2015-12-11 DIAGNOSIS — F0151 Vascular dementia with behavioral disturbance: Secondary | ICD-10-CM | POA: Diagnosis not present

## 2015-12-11 DIAGNOSIS — L89890 Pressure ulcer of other site, unstageable: Secondary | ICD-10-CM | POA: Diagnosis not present

## 2015-12-11 DIAGNOSIS — F01518 Vascular dementia, unspecified severity, with other behavioral disturbance: Secondary | ICD-10-CM

## 2015-12-11 NOTE — Progress Notes (Signed)
Location:  [facility] Room Number: [phone]  Place of Service:  SNF [phone]) Provider:  Dinah Ngetich FNP-C   Olevia Perches, DO  Patient Care Team: Dorcas Carrow, DO as PCP - General (Family Medicine)  Extended Emergency Contact Information Primary Emergency Contact: Sturdivant,Deloris Address: [address]          [place], [place] [address] [place] of [place] Home Phone: [phone] Mobile Phone: [phone] Relation: Daughter Secondary Emergency Contact: Ross,Jacqueline Address: [phone]          [place], [place] [address] [place] of [place] Home Phone: [phone] Relation: Daughter  Code Status:  DNR  Goals of care: Advanced Directive information Advanced Directives 11/22/2015  Does patient have an advance directive? Yes  Type of Advance Directive Out of facility DNR (pink MOST or yellow form)  Does patient want to make changes to advanced directive? -  Copy of advanced directive(s) in chart? Yes  Would patient like information on creating an advanced directive? -  Pre-existing out of facility DNR order (yellow form or pink MOST form) -     Chief Complaint  Patient presents with  . Medical Management of Chronic Issues    HPI:  Pt is a 82 y.o. female seen today at [facility] for medical management of chronic diseases.She has a medical history of HTN,Dementia, CVA, dysphagia among other conditions. She is seen in her room today. She is non-verbal unable to provide HPI and ROS information. She was recently treated with oral ABX for UTI. Facility nurse reports no new behavioral issues.      Past Medical History:  Diagnosis Date  . Alzheimer disease   . Arthritis   . Frequent falls   . Hypertension   . Kidney disease   . Seizure [facility])    Past Surgical History:  Procedure Laterality Date  . TUBAL LIGATION      No Known Allergies    Medication List       Accurate as of 12/11/15  5:19 PM.  Always use your most recent med list.          acetaminophen 500 MG tablet Commonly known as:  TYLENOL Take 1 tablet (500 mg total) by mouth 2 (two) times daily.   alum & mag hydroxide-simeth 200-200-20 MG/5ML suspension Commonly known as:  MAALOX/MYLANTA Take 30 mLs by mouth as needed for indigestion or heartburn.   amLODipine 5 MG tablet Commonly known as:  NORVASC Take 5 mg by mouth daily.   bismuth subsalicylate 262 MG/15ML suspension Commonly known as:  PEPTO BISMOL Take 30 mLs by mouth every 6 (six) hours as needed (for GI upset).   clopidogrel 75 MG tablet Commonly known as:  PLAVIX Take 1 tablet (75 mg total) by mouth daily.   Cranberry Fruit 405 MG Caps TAKE 1 CAPSULE BY MOUTH TWICE DAILY TO PREVENT UTI   divalproex 125 MG capsule Commonly known as:  DEPAKOTE SPRINKLE Take 250 mg by mouth 2 (two) times daily.   donepezil 10 MG tablet Commonly known as:  ARICEPT TAKE 1 TABLET BY MOUTH ONCE DAILY FOR MEMORY   GNP VITAMIN D SUPER STRENGTH 5000 units Tabs Generic drug:  Cholecalciferol TAKE 1 TABLET BY MOUTH ONCE DAILY FOR SUPPLEMENT   latanoprost 0.005 % ophthalmic solution Commonly known as:  XALATAN Place 1 drop into both eyes at bedtime.   loperamide 2 MG tablet Commonly known as:  IMODIUM A-D Take 2 mg by mouth  4 (four) times daily as needed for diarrhea or loose stools.   magnesium hydroxide 400 MG/5ML suspension Commonly known as:  MILK OF MAGNESIA Take 30 mLs by mouth daily as needed for mild constipation.   multivitamin with minerals Tabs tablet Take 1 tablet by mouth daily.   PROTEIN SUPPLEMENT 80% PO Take 90 mLs by mouth 2 (two) times daily.   saccharomyces boulardii 250 MG capsule Commonly known as:  FLORASTOR Take 250 mg by mouth 2 (two) times daily. Stop date: 12/02/15       Review of Systems  Unable to perform ROS: Patient nonverbal    Immunization History  Administered Date(s) Administered  . Influenza Whole 12/29/2013  .  Influenza-Unspecified 11/27/2014  . PPD Test 09/14/2015, 09/28/2015  . Td 01/11/2015   Pertinent  Health Maintenance Due  Topic Date Due  . DEXA SCAN  06/17/1988  . PNA vac Low Risk Adult (1 of 2 - PCV13) 06/17/1988  . INFLUENZA VACCINE  09/18/2015   No flowsheet data found. Functional Status Survey:    Vitals:   12/11/15 1100  BP: (!) 146/81  Pulse: 76  Resp: 18  Temp: 97.7 F (36.5 C)  SpO2: 96%  Weight: 97 lb 3.2 oz (44.1 kg)  Height: 5\' 1"  (1.549 m)   Body mass index is 18.37 kg/m. Physical Exam  Constitutional:  Thin frail Elderly non-verbal in no acute distress.  HENT:  Head: Normocephalic.  Mouth/Throat: Oropharynx is clear and moist. No oropharyngeal exudate.  Eyes: Conjunctivae and EOM are normal. Pupils are equal, round, and reactive to light. Right eye exhibits no discharge. Left eye exhibits no discharge. No scleral icterus.  Neck: Normal range of motion. No JVD present. No thyromegaly present.  Cardiovascular: Normal rate, regular rhythm, normal heart sounds and intact distal pulses.  Exam reveals no gallop and no friction rub.   No murmur heard. Pulmonary/Chest: Effort normal and breath sounds normal. No respiratory distress. She has no wheezes. She has no rales.  Abdominal: Soft. Bowel sounds are normal. She exhibits no distension. There is no tenderness. There is no rebound and no guarding.  Genitourinary:  Genitourinary Comments: Incontinent for both bowel and bladder  Musculoskeletal: She exhibits no edema, tenderness or deformity.  Wheelchair/bed bound. Heel protectors in place.    Lymphadenopathy:    She has no cervical adenopathy.  Neurological: She is alert.  Non-verbal.  Skin: Skin is warm and dry. No rash noted. No erythema. No pallor.  Right great toe:  drsg changed by wound care nurse. Surrounding skin tissue without any signs of infections.   Psychiatric: She has a normal mood and affect.    Labs reviewed:  Recent Labs  08/06/15 1849  08/19/15 0742 08/20/15 0308 09/18/15 09/25/15 10/23/15  NA 138 139 139 142 141 140  K 4.5 4.4 4.0 4.4 4.1 4.6  CL 100* 102 105  --   --   --   CO2 30 30 31   --   --   --   GLUCOSE 102* 110* 82  --   --   --   BUN 19 13 18 14 12 10   CREATININE 0.86 0.67 0.66 0.5 0.4* 0.3*  CALCIUM 9.4 9.0 8.7*  --   --   --     Recent Labs  12/24/14 1324 05/21/15 1028 08/06/15 1849 09/18/15 09/25/15 10/23/15  AST 52* 40 47* 54* 59* 36*  ALT 47 38* 37 75* 86* 43*  ALKPHOS 98 78 66 86 79 78  BILITOT 0.3 <  0.2 0.4  --   --   --   PROT 7.3 6.3 6.4*  --   --   --   ALBUMIN 3.0* 3.3 2.9*  --   --   --     Recent Labs  05/21/15 1028 08/06/15 1849 08/19/15 0742 08/20/15 0308 09/18/15  WBC 6.1 6.5 8.0 5.8 5.5  NEUTROABS 3.8 4.0  --   --   --   HGB  --  12.1 12.1 11.0* 13.0  HCT 37.1 37.8 35.9 32.4* 41  MCV 89 88.7 86.5 86.2  --   PLT 283 213 191 179 242   Lab Results  Component Value Date   TSH 2.390 05/21/2015   Lab Results  Component Value Date   HGBA1C 5.9 08/19/2015   Lab Results  Component Value Date   CHOL 176 08/21/2015   HDL 54 08/21/2015   LDLCALC 113 (H) 08/21/2015   TRIG 46 08/21/2015   CHOLHDL 3.3 08/21/2015   Assessment/Plan 1. Benign hypertensive renal disease B/P stable. Continue Amlodipine. Monitor BMP  2. Vascular dementia with behavior disturbance No new behavioral issues reported. Continue MVI Cont to assist with ADL's  3. Mild protein-calorie malnutrition (HCC) Continue with protein supplement and encourage oral intake.   4. Right sided weakness Secondary to CVA. Continue to assist with ADL's. Air mattress to prevent skin breakdown.  5. Oropharyngeal dysphagia Aspiration precautions.   6. Pressure ulcer of toe of right foot, unstageable (HCC) Continue wound care. MVI and protein supplements to promote wound healing. Continue with heel protectors.     Family/ staff Communication: Reviewed plan of care with facility Nurse supervisor.   Labs/tests  ordered:  None

## 2015-12-14 ENCOUNTER — Non-Acute Institutional Stay (SKILLED_NURSING_FACILITY): Payer: Medicare Other | Admitting: Family

## 2015-12-14 ENCOUNTER — Encounter: Payer: Self-pay | Admitting: Family

## 2015-12-14 DIAGNOSIS — L89510 Pressure ulcer of right ankle, unstageable: Secondary | ICD-10-CM | POA: Diagnosis not present

## 2015-12-14 DIAGNOSIS — L89610 Pressure ulcer of right heel, unstageable: Secondary | ICD-10-CM

## 2015-12-14 DIAGNOSIS — R638 Other symptoms and signs concerning food and fluid intake: Secondary | ICD-10-CM | POA: Diagnosis not present

## 2015-12-14 DIAGNOSIS — I739 Peripheral vascular disease, unspecified: Secondary | ICD-10-CM

## 2015-12-14 DIAGNOSIS — L89890 Pressure ulcer of other site, unstageable: Secondary | ICD-10-CM | POA: Diagnosis not present

## 2015-12-14 NOTE — Progress Notes (Signed)
Location:  [facility] Room Number: [phone] Place of Service:  SNF [phone]) Provider:  Richarda Blade, FNP-C   Olevia Perches, DO  Patient Care Team: Dorcas Carrow, DO as PCP - General (Family Medicine)  Extended Emergency Contact Information Primary Emergency Contact: Sturdivant,Deloris Address: [address]          [place], [place] [address] [place] Home Phone: [phone] Mobile Phone: [phone] Relation: Daughter Secondary Emergency Contact: Ross,Jacqueline Address: [phone]          [place], [place] [address] [place] of [place] Home Phone: [phone] Relation: Daughter  Code Status:  DNR Goals of care: Advanced Directive information Advanced Directives 12/14/2015  Does patient have an advance directive? Yes  Type of Advance Directive Out of facility DNR (pink MOST or yellow form)  Does patient want to make changes to advanced directive? No - Patient declined  Copy of advanced directive(s) in chart? Yes  Would patient like information on creating an advanced directive? -  Pre-existing out of facility DNR order (yellow form or pink MOST form) -     Chief Complaint  Patient presents with  . Acute Visit    Pain    HPI:  Pt is a 82 y.o. female seen today at [facility]  for an acute visit for evaluation of pain. She has a medical history of HTN, Alzheimer, Seizure, PAD among other conditions. She is seen in her room today per facility wound Nurse requested. Wound Nurse states patient seems to be in pain during right foot drsg changes kicking and pulling leg more than usual. Facility staff also reports patient has had decreased oral intake for the past two days. Facility Nurse states patient takes meds but refuses to eat. Patient's decline in condition discussed with daughter who states will discussed with the rest of the brothers and sisters then will notify facility option for palliative care or  hospice.     Past Medical History:  Diagnosis Date  . Alzheimer disease   . Arthritis   . Frequent falls   . Hypertension   . Kidney disease   . Seizure [facility])    Past Surgical History:  Procedure Laterality Date  . TUBAL LIGATION      No Known Allergies    Medication List       Accurate as of 12/14/15 11:59 PM. Always use your most recent med list.          acetaminophen 500 MG tablet Commonly known as:  TYLENOL Take 1 tablet (500 mg total) by mouth 2 (two) times daily.   alum & mag hydroxide-simeth 200-200-20 MG/5ML suspension Commonly known as:  MAALOX/MYLANTA Take 30 mLs by mouth as needed for indigestion or heartburn.   amLODipine 5 MG tablet Commonly known as:  NORVASC Take 5 mg by mouth daily.   bismuth subsalicylate 262 MG/15ML suspension Commonly known as:  PEPTO BISMOL Take 30 mLs by mouth every 6 (six) hours as needed (for GI upset).   clopidogrel 75 MG tablet Commonly known as:  PLAVIX Take 1 tablet (75 mg total) by mouth daily.   Cranberry Fruit 405 MG Caps TAKE 1 CAPSULE BY MOUTH TWICE DAILY TO PREVENT UTI   DECUBI-VITE Caps Take 1 capsule by mouth daily.   divalproex 125 MG capsule Commonly known as:  DEPAKOTE SPRINKLE Take 125 mg by mouth 2 (two) times daily.   donepezil 10 MG tablet Commonly known as:  ARICEPT  TAKE 1 TABLET BY MOUTH ONCE DAILY FOR MEMORY   feeding supplement (PRO-STAT SUGAR FREE 64) Liqd Take 30 mLs by mouth daily.   GNP VITAMIN D SUPER STRENGTH 5000 units Tabs Generic drug:  Cholecalciferol TAKE 1 TABLET BY MOUTH ONCE DAILY FOR SUPPLEMENT   latanoprost 0.005 % ophthalmic solution Commonly known as:  XALATAN Place 1 drop into both eyes at bedtime.   loperamide 2 MG tablet Commonly known as:  IMODIUM A-D Take 2 mg by mouth 4 (four) times daily as needed for diarrhea or loose stools.   magnesium hydroxide 400 MG/5ML suspension Commonly known as:  MILK OF MAGNESIA Take 30 mLs by mouth daily as needed for mild  constipation.   PROTEIN SUPPLEMENT 80% PO Take 90 mLs by mouth 2 (two) times daily. Medpass 2.0       Review of Systems  Unable to perform ROS: Patient nonverbal    Immunization History  Administered Date(s) Administered  . Influenza Whole 12/29/2013  . Influenza-Unspecified 11/27/2014  . PPD Test 09/14/2015, 09/28/2015  . Td 01/11/2015   Pertinent  Health Maintenance Due  Topic Date Due  . DEXA SCAN  06/17/1988  . INFLUENZA VACCINE  05/17/2016 (Originally 09/18/2015)  . PNA vac Low Risk Adult (1 of 2 - PCV13) 05/17/2016 (Originally 06/17/1988)      Vitals:   12/14/15 1446  BP: (!) 146/71  Pulse: 79  Resp: 19  Temp: 97.9 F (36.6 C)  SpO2: 98%  Weight: 97 lb 3.2 oz (44.1 kg)  Height: 5\' 1"  (1.549 m)   Body mass index is 18.37 kg/m. Physical Exam  Constitutional:  Thin frail Elderly non-verbal in no acute distress.  HENT:  Head: Normocephalic.  Mouth/Throat: Oropharynx is clear and moist. No oropharyngeal exudate.  Eyes: Conjunctivae and EOM are normal. Pupils are equal, round, and reactive to light. Right eye exhibits no discharge. Left eye exhibits no discharge. No scleral icterus.  Neck: Normal range of motion. No JVD present. No thyromegaly present.  Cardiovascular: Normal rate, regular rhythm, normal heart sounds and intact distal pulses.  Exam reveals no gallop and no friction rub.   No murmur heard. Pulmonary/Chest: Effort normal and breath sounds normal. No respiratory distress. She has no wheezes. She has no rales.  Abdominal: Soft. Bowel sounds are normal. She exhibits no distension. There is no tenderness. There is no rebound and no guarding.  Genitourinary:  Genitourinary Comments: Incontinent for both bowel and bladder  Musculoskeletal: She exhibits no edema, tenderness or deformity.  Wheelchair/bed bound. Heel protectors in place.    Lymphadenopathy:    She has no cervical adenopathy.  Neurological: She is alert.  Non-verbal.  Skin: Skin is warm  and dry. No rash noted. No erythema. No pallor.  Right great toe, heel, lateral shin and ankle area Surrounding skin tissue without any signs of infections.   Psychiatric: She has a normal mood and affect.    Labs reviewed:  Recent Labs  08/06/15 1849 08/19/15 0742 08/20/15 0308 09/18/15 09/25/15 10/23/15  NA 138 139 139 142 141 140  K 4.5 4.4 4.0 4.4 4.1 4.6  CL 100* 102 105  --   --   --   CO2 30 30 31   --   --   --   GLUCOSE 102* 110* 82  --   --   --   BUN 19 13 18 14 12 10   CREATININE 0.86 0.67 0.66 0.5 0.4* 0.3*  CALCIUM 9.4 9.0 8.7*  --   --   --  Recent Labs  12/24/14 1324 05/21/15 1028 08/06/15 1849 09/18/15 09/25/15 10/23/15  AST 52* 40 47* 54* 59* 36*  ALT 47 38* 37 75* 86* 43*  ALKPHOS 98 78 66 86 79 78  BILITOT 0.3 <0.2 0.4  --   --   --   PROT 7.3 6.3 6.4*  --   --   --   ALBUMIN 3.0* 3.3 2.9*  --   --   --     Recent Labs  05/21/15 1028 08/06/15 1849 08/19/15 0742 08/20/15 0308 09/18/15  WBC 6.1 6.5 8.0 5.8 5.5  NEUTROABS 3.8 4.0  --   --   --   HGB  --  12.1 12.1 11.0* 13.0  HCT 37.1 37.8 35.9 32.4* 41  MCV 89 88.7 86.5 86.2  --   PLT 283 213 191 179 242   Lab Results  Component Value Date   TSH 2.390 05/21/2015   Lab Results  Component Value Date   HGBA1C 5.9 08/19/2015   Lab Results  Component Value Date   CHOL 129 11/19/2015   HDL 42 11/19/2015   LDLCALC 75 11/19/2015   TRIG 61 11/19/2015   CHOLHDL 3.3 08/21/2015    Assessment/Plan PAD Recent ABI's showed peripheral arterial disease. Will add to Dx list. Right lateral ulcer wound bed yellow with pink edges. No drainage noted. Kicking and pulling leg during wound care drsg changes more than usual. Will Add tramadol 50 mg Tablet 1/2 Tablet (25 mg ) every 8 HRS PRN and 30 minutes prior right leg dressing changes.  Unstageable right heel ulcer Dark eschar noted. Surrounding skin without signs of infections. Continue heel protectors. Continue wound care.  unstageable right  great toe ulcer Dark eschar.continue wound care for now.     unstageable right ankle ulcer Continue current wound care.   Decreased oral intake Decline in condition. Provider discussed with daughter patient's decline in condition who states will discussed with the rest of the brothers and sisters then will notify facility option for palliative care or hospice.continue to monitor.     Family/ staff Communication: Reviewed plan of care with patient's daughter over the phone in the presence of facility Nurse supervisor.   Labs/tests ordered:  None

## 2015-12-17 DIAGNOSIS — I739 Peripheral vascular disease, unspecified: Secondary | ICD-10-CM | POA: Insufficient documentation

## 2015-12-25 ENCOUNTER — Encounter: Payer: Self-pay | Admitting: Family

## 2015-12-25 ENCOUNTER — Non-Acute Institutional Stay (SKILLED_NURSING_FACILITY): Payer: Medicare Other | Admitting: Family

## 2015-12-25 DIAGNOSIS — M79604 Pain in right leg: Secondary | ICD-10-CM

## 2015-12-25 DIAGNOSIS — R634 Abnormal weight loss: Secondary | ICD-10-CM

## 2015-12-25 DIAGNOSIS — I739 Peripheral vascular disease, unspecified: Secondary | ICD-10-CM

## 2015-12-25 DIAGNOSIS — R5381 Other malaise: Secondary | ICD-10-CM

## 2015-12-25 DIAGNOSIS — E440 Moderate protein-calorie malnutrition: Secondary | ICD-10-CM | POA: Diagnosis not present

## 2015-12-25 NOTE — Progress Notes (Signed)
Location:  [facility] Room Number: 801-A Place of Service:  SNF [phone]) Provider:  Richarda Bladeinah Ngetich, NP  Olevia PerchesMegan Johnson, DO  Patient Care Team: Dorcas CarrowMegan P Johnson, DO as PCP - General (Family Medicine)  Extended Emergency Contact Information Primary Emergency Contact: Sturdivant,Deloris Address: [address]          [place], [place] [address] [place] Home Phone: [phone] Mobile Phone: [phone] Relation: Daughter Secondary Emergency Contact: Ross,Jacqueline Address: [address]          [place], [place] [id number] [place] Home Phone: [phone] Relation: Daughter  Code Status:  DNR Goals of care: Advanced Directive information Advanced Directives 12/25/2015  Does patient have an advance directive? Yes  Type of Advance Directive Out of facility DNR (pink MOST or yellow form)  Does patient want to make changes to advanced directive? No - Patient declined  Copy of advanced directive(s) in chart? Yes  Would patient like information on creating an advanced directive? -  Pre-existing out of facility DNR order (yellow form or pink MOST form) -     Chief Complaint  Patient presents with  . Acute Visit    Acute Concerns     HPI:  Pt is a 82 y.o. female seen today at [facility] for an acute visit for evaluation of right leg pain. She has a significant medical history of PAD, Dementia, CVA, HTN, Seizures among other conditions. She is seen in her room today per facility wound Nurse request. Facility Nurse reports patient continues to be in pain during right leg wound care despite current pain medication regimen. Patient continues to kick and withdraws leg. She has had progressive weight loss, poor oral intake and new development of arterial ulcers. Patient's declining condition discussed during recent conference meeting with patient's POA and other family members. Patient's POA now request Hospice  consults due to advance age and decline in condition.     Past Medical History:  Diagnosis Date  . Alzheimer disease   . Arthritis   . Frequent falls   . Hypertension   . Kidney disease   . Seizure [facility](HCC)    Past Surgical History:  Procedure Laterality Date  . TUBAL LIGATION      No Known Allergies    Medication List       Accurate as of 12/25/15 12:23 PM. Always use your most recent med list.          acetaminophen 500 MG tablet Commonly known as:  TYLENOL Take 1 tablet (500 mg total) by mouth 2 (two) times daily.   alum & mag hydroxide-simeth 200-200-20 MG/5ML suspension Commonly known as:  MAALOX/MYLANTA Take 30 mLs by mouth as needed for indigestion or heartburn.   amLODipine 5 MG tablet Commonly known as:  NORVASC Take 5 mg by mouth daily.   bismuth subsalicylate 262 MG/15ML suspension Commonly known as:  PEPTO BISMOL Take 30 mLs by mouth every 6 (six) hours as needed (for GI upset).   clopidogrel 75 MG tablet Commonly known as:  PLAVIX Take 1 tablet (75 mg total) by mouth daily.   Cranberry Fruit 405 MG Caps TAKE 1 CAPSULE BY MOUTH TWICE DAILY TO PREVENT UTI   DECUBI-VITE Caps Take 1 capsule by mouth daily.   divalproex 125 MG capsule Commonly known as:  DEPAKOTE SPRINKLE Take 125 mg by mouth 2 (two) times daily.   donepezil 10 MG tablet Commonly known as:  ARICEPT TAKE  1 TABLET BY MOUTH ONCE DAILY FOR MEMORY   feeding supplement (PRO-STAT SUGAR FREE 64) Liqd Take 30 mLs by mouth daily.   GNP VITAMIN D SUPER STRENGTH 5000 units Tabs Generic drug:  Cholecalciferol TAKE 1 TABLET BY MOUTH ONCE DAILY FOR SUPPLEMENT   latanoprost 0.005 % ophthalmic solution Commonly known as:  XALATAN Place 1 drop into both eyes at bedtime.   loperamide 2 MG tablet Commonly known as:  IMODIUM A-D Take 2 mg by mouth 4 (four) times daily as needed for diarrhea or loose stools.   magnesium hydroxide 400 MG/5ML suspension Commonly known as:  MILK OF  MAGNESIA Take 30 mLs by mouth daily as needed for mild constipation.   PROTEIN SUPPLEMENT 80% PO Take 90 mLs by mouth 2 (two) times daily. Medpass 2.0   traMADol 50 MG tablet Commonly known as:  ULTRAM 1/2 tablet every 8 hours as needed for pain and 1/2 tablet three times per week prior to right leg/foot wound dressing changes       Review of Systems  Unable to perform ROS: Patient nonverbal    Immunization History  Administered Date(s) Administered  . Influenza Whole 12/29/2013  . Influenza-Unspecified 11/27/2014  . PPD Test 09/14/2015, 09/28/2015  . Td 01/11/2015   Pertinent  Health Maintenance Due  Topic Date Due  . DEXA SCAN  06/17/1988  . INFLUENZA VACCINE  05/17/2016 (Originally 09/18/2015)  . PNA vac Low Risk Adult (1 of 2 - PCV13) 05/17/2016 (Originally 06/17/1988)      Vitals:   12/25/15 1159  BP: 102/70  Pulse: 64  Resp: 20  Temp: 98.1 F (36.7 C)  SpO2: 94%  Weight: 90 lb 9.6 oz (41.1 kg)  Height: 5\' 1"  (1.549 m)   Body mass index is 17.12 kg/m. Physical Exam  Constitutional:  Thin frail elderly non-verbal in no acute distress.  HENT:  Head: Normocephalic.  Mouth/Throat: Oropharynx is clear and moist. No oropharyngeal exudate.  Eyes: Conjunctivae and EOM are normal. Pupils are equal, round, and reactive to light. Right eye exhibits no discharge. Left eye exhibits no discharge. No scleral icterus.  Neck: Normal range of motion. No JVD present. No thyromegaly present.  Cardiovascular: Normal rate, regular rhythm, normal heart sounds and intact distal pulses.  Exam reveals no gallop and no friction rub.   No murmur heard. Pulmonary/Chest: Effort normal and breath sounds normal. No respiratory distress. She has no wheezes. She has no rales.  Abdominal: Soft. Bowel sounds are normal. She exhibits no distension. There is no tenderness. There is no rebound and no guarding.  Genitourinary:  Genitourinary Comments: Incontinent for both bowel and bladder   Musculoskeletal: She exhibits no edema, tenderness or deformity.  Wheelchair/bed bound. Heel protectors in place.    Lymphadenopathy:    She has no cervical adenopathy.  Neurological: She is alert.  Alert but non-verbal.  Skin: Skin is warm and dry. No rash noted. No erythema. No pallor.  Right great toe, heel, lateral shin and ankle area arterial ulcers.Surrounding skin tissue without any signs of infections.   Psychiatric: She has a normal mood and affect.    Labs reviewed:  Recent Labs  08/06/15 1849 08/19/15 0742 08/20/15 0308 09/18/15 09/25/15 10/23/15  NA 138 139 139 142 141 140  K 4.5 4.4 4.0 4.4 4.1 4.6  CL 100* 102 105  --   --   --   CO2 30 30 31   --   --   --   GLUCOSE 102* 110* 82  --   --   --  BUN 19 13 18 14 12 10   CREATININE 0.86 0.67 0.66 0.5 0.4* 0.3*  CALCIUM 9.4 9.0 8.7*  --   --   --     Recent Labs  05/21/15 1028 08/06/15 1849 09/18/15 09/25/15 10/23/15  AST 40 47* 54* 59* 36*  ALT 38* 37 75* 86* 43*  ALKPHOS 78 66 86 79 78  BILITOT <0.2 0.4  --   --   --   PROT 6.3 6.4*  --   --   --   ALBUMIN 3.3 2.9*  --   --   --     Recent Labs  05/21/15 1028  08/06/15 1849 08/19/15 0742 08/20/15 0308 09/18/15  WBC 6.1  < > 6.5 8.0 5.8 5.5  NEUTROABS 3.8  --  4.0  --   --   --   HGB  --   < > 12.1 12.1 11.0* 13.0  HCT 37.1  < > 37.8 35.9 32.4* 41  MCV 89  --  88.7 86.5 86.2  --   PLT 283  < > 213 191 179 242  < > = values in this interval not displayed. Lab Results  Component Value Date   TSH 2.390 05/21/2015   Lab Results  Component Value Date   HGBA1C 5.9 08/19/2015   Lab Results  Component Value Date   CHOL 129 11/19/2015   HDL 42 11/19/2015   LDLCALC 75 11/19/2015   TRIG 61 11/19/2015   CHOLHDL 3.3 08/21/2015   Assessment/Plan Physical Deconditioning Has poor oral intake and weight loss. POA request Hospice consults due to advance age, dementia and new development of right leg arterial ulcers. Consult Hospice.   Loss of weight   Previous weight on admission in the 100's now down to 90 lbs. Continue to follow up with RD for supplements.   Right leg pain   Current regimen ineffective during wound care. Will discontinue Tramadol then start Hydrocodone/APAP 5/325 mg Tablet one by mouth twice daily. Continue to monitor. Consult Hospice for pain.   Protein Malnutrition  Continue with protein supplements. RD to continue to monitor. ST to continue to evaluate swallowing and feeding techniques.   PAD  Has had two addition ulcer despite use of provolone boots for pressure ulcers preventions. Continue wound care.     Family/ staff Communication: Reviewed plan of care with facility wound Nurse and facility Nurse supervisor.   Labs/tests ordered:  None

## 2015-12-28 ENCOUNTER — Non-Acute Institutional Stay (SKILLED_NURSING_FACILITY): Payer: Medicare Other | Admitting: Family

## 2015-12-28 DIAGNOSIS — B373 Candidiasis of vulva and vagina: Secondary | ICD-10-CM | POA: Diagnosis not present

## 2015-12-28 DIAGNOSIS — B3731 Acute candidiasis of vulva and vagina: Secondary | ICD-10-CM

## 2015-12-28 MED ORDER — FLUCONAZOLE 150 MG PO TABS: 150.0000 mg | ORAL_TABLET | Freq: Once | ORAL | 0 refills | Status: AC

## 2015-12-28 NOTE — Progress Notes (Signed)
Location:  [facility] Room Number: [phone] A Place of Service:  SNF (31) Provider: Meriam Chojnowski FNP-C   Olevia PerchesMegan Johnson, DO  Patient Care Team: Dorcas CarrowMegan P Johnson, DO as PCP - General (Family Medicine)  Extended Emergency Contact Information Primary Emergency Contact: Sturdivant,Deloris Address: [address]          [place], [place] [address] [place] Home Phone: [phone] Mobile Phone: [phone] Relation: Daughter Secondary Emergency Contact: Ross,Jacqueline Address: [address]          [place], [place] [address] [place] Home Phone: [phone] Relation: Daughter  Code Status:  DNR  Goals of care: Advanced Directive information Advanced Directives 12/25/2015  Does patient have an advance directive? Yes  Type of Advance Directive Out of facility DNR (pink MOST or yellow form)  Does patient want to make changes to advanced directive? No - Patient declined  Copy of advanced directive(s) in chart? Yes  Would patient like information on creating an advanced directive? -  Pre-existing out of facility DNR order (yellow form or pink MOST form) -     Chief Complaint  Patient presents with  . Acute Visit    scratching vaginal area    HPI:  Pt is a 82 y.o. female seen today at [facility]  for an acute visit for evaluation of itching in the periarea. She has a significant medical history of dementia, CVA, seizures, HTN among other conditions. She is seen in her room today per facility Nurse request. Nurse reports patient scratching vaginal area. No strong odor or discharge reported by CNA but scratches. Unable to obtain HPI and ROS due to dementia.No fever or chills reported. She was recently admitted to hospice service.      Past Medical History:  Diagnosis Date  . Alzheimer disease   . Arthritis   . Frequent falls   . Hypertension   . Kidney disease   . Seizure [facility](HCC)    Past Surgical  History:  Procedure Laterality Date  . TUBAL LIGATION      No Known Allergies    Medication List       Accurate as of 12/28/15  4:54 PM. Always use your most recent med list.          acetaminophen 500 MG tablet Commonly known as:  TYLENOL Take 1 tablet (500 mg total) by mouth 2 (two) times daily.   alum & mag hydroxide-simeth 200-200-20 MG/5ML suspension Commonly known as:  MAALOX/MYLANTA Take 30 mLs by mouth as needed for indigestion or heartburn.   amLODipine 5 MG tablet Commonly known as:  NORVASC Take 5 mg by mouth daily.   bismuth subsalicylate 262 MG/15ML suspension Commonly known as:  PEPTO BISMOL Take 30 mLs by mouth every 6 (six) hours as needed (for GI upset).   clopidogrel 75 MG tablet Commonly known as:  PLAVIX Take 1 tablet (75 mg total) by mouth daily.   Cranberry Fruit 405 MG Caps TAKE 1 CAPSULE BY MOUTH TWICE DAILY TO PREVENT UTI   DECUBI-VITE Caps Take 1 capsule by mouth daily.   divalproex 125 MG capsule Commonly known as:  DEPAKOTE SPRINKLE Take 125 mg by mouth 2 (two) times daily.   donepezil 10 MG tablet Commonly known as:  ARICEPT TAKE 1 TABLET BY MOUTH ONCE DAILY FOR MEMORY   feeding supplement (PRO-STAT SUGAR FREE 64) Liqd Take 30 mLs by mouth daily.   GNP VITAMIN D SUPER STRENGTH  5000 units Tabs Generic drug:  Cholecalciferol TAKE 1 TABLET BY MOUTH ONCE DAILY FOR SUPPLEMENT   latanoprost 0.005 % ophthalmic solution Commonly known as:  XALATAN Place 1 drop into both eyes at bedtime.   loperamide 2 MG tablet Commonly known as:  IMODIUM A-D Take 2 mg by mouth 4 (four) times daily as needed for diarrhea or loose stools.   magnesium hydroxide 400 MG/5ML suspension Commonly known as:  MILK OF MAGNESIA Take 30 mLs by mouth daily as needed for mild constipation.   PROTEIN SUPPLEMENT 80% PO Take 90 mLs by mouth 2 (two) times daily. Medpass 2.0   traMADol 50 MG tablet Commonly known as:  ULTRAM 1/2 tablet every 8 hours as  needed for pain and 1/2 tablet three times per week prior to right leg/foot wound dressing changes       Review of Systems  Unable to perform ROS: Patient nonverbal    Immunization History  Administered Date(s) Administered  . Influenza Whole 12/29/2013  . Influenza-Unspecified 11/27/2014  . PPD Test 09/14/2015, 09/28/2015  . Td 01/11/2015   Pertinent  Health Maintenance Due  Topic Date Due  . DEXA SCAN  06/17/1988  . INFLUENZA VACCINE  05/17/2016 (Originally 09/18/2015)  . PNA vac Low Risk Adult (1 of 2 - PCV13) 05/17/2016 (Originally 06/17/1988)      Vitals:   12/28/15 1600  BP: 126/62  Pulse: 73  Resp: 18  Temp: 97.5 F (36.4 C)  SpO2: 94%  Weight: 90 lb 9.6 oz (41.1 kg)  Height: 5\' 1"  (1.549 m)   Body mass index is 17.12 kg/m. Physical Exam  Constitutional: No distress.  Thin frail elderly non-verbal at baseline.   HENT:  Head: Normocephalic.  Mouth/Throat: Oropharynx is clear and moist. No oropharyngeal exudate.  Eyes: Conjunctivae and EOM are normal. Pupils are equal, round, and reactive to light. Right eye exhibits no discharge. Left eye exhibits no discharge. No scleral icterus.  Neck: Normal range of motion. No JVD present. No thyromegaly present.  Cardiovascular: Normal rate, regular rhythm, normal heart sounds and intact distal pulses.  Exam reveals no gallop and no friction rub.   No murmur heard. Pulmonary/Chest: Effort normal and breath sounds normal. No respiratory distress. She has no wheezes. She has no rales.  Abdominal: Soft. Bowel sounds are normal. She exhibits no distension. There is no tenderness. There is no rebound and no guarding.  Genitourinary:  Genitourinary Comments: Incontinent for both bowel and bladder. Scratching vulvovaginal during visit.   Musculoskeletal: She exhibits no edema, tenderness or deformity.  Wheelchair/bed bound. provolone boots  in place.    Lymphadenopathy:    She has no cervical adenopathy.  Neurological: She is  alert.  Alert but non-verbal at baseline.   Skin: Skin is warm and dry. No rash noted. No erythema. No pallor.  Right great toe, heel, lateral shin and ankle area arterial ulcers drsg changed by wound Nurse not visualized this visit.   Psychiatric: She has a normal mood and affect.    Labs reviewed:  Recent Labs  08/06/15 1849 08/19/15 0742 08/20/15 0308 09/18/15 09/25/15 10/23/15  NA 138 139 139 142 141 140  K 4.5 4.4 4.0 4.4 4.1 4.6  CL 100* 102 105  --   --   --   CO2 30 30 31   --   --   --   GLUCOSE 102* 110* 82  --   --   --   BUN 19 13 18 14 12 10   CREATININE  0.86 0.67 0.66 0.5 0.4* 0.3*  CALCIUM 9.4 9.0 8.7*  --   --   --     Recent Labs  05/21/15 1028 08/06/15 1849 09/18/15 09/25/15 10/23/15  AST 40 47* 54* 59* 36*  ALT 38* 37 75* 86* 43*  ALKPHOS 78 66 86 79 78  BILITOT <0.2 0.4  --   --   --   PROT 6.3 6.4*  --   --   --   ALBUMIN 3.3 2.9*  --   --   --     Recent Labs  05/21/15 1028  08/06/15 1849 08/19/15 0742 08/20/15 0308 09/18/15  WBC 6.1  < > 6.5 8.0 5.8 5.5  NEUTROABS 3.8  --  4.0  --   --   --   HGB  --   < > 12.1 12.1 11.0* 13.0  HCT 37.1  < > 37.8 35.9 32.4* 41  MCV 89  --  88.7 86.5 86.2  --   PLT 283  < > 213 191 179 242  < > = values in this interval not displayed. Lab Results  Component Value Date   TSH 2.390 05/21/2015   Lab Results  Component Value Date   HGBA1C 5.9 08/19/2015   Lab Results  Component Value Date   CHOL 129 11/19/2015   HDL 42 11/19/2015   LDLCALC 75 11/19/2015   TRIG 61 11/19/2015   CHOLHDL 3.3 08/21/2015   Assessment/Plan Vulvovaginal candidiasis Scratching vulvovaginal area. No strong odor or discharge noted. Afebrile. Will treat clinically. Diflucan 150 mg tablet X 1 dose now then repeat x 1 dose in one week. Continue to monitor. Provider frequent incontinent care.     Family/ staff Communication: Reviewed with patient's Nurse and facility Nurse supervisor.   Labs/tests ordered: None

## 2016-01-24 ENCOUNTER — Non-Acute Institutional Stay (SKILLED_NURSING_FACILITY): Payer: Medicare Other | Admitting: Internal Medicine

## 2016-01-24 ENCOUNTER — Encounter: Payer: Self-pay | Admitting: Internal Medicine

## 2016-01-24 DIAGNOSIS — R569 Unspecified convulsions: Secondary | ICD-10-CM

## 2016-01-24 DIAGNOSIS — L98499 Non-pressure chronic ulcer of skin of other sites with unspecified severity: Secondary | ICD-10-CM

## 2016-01-24 DIAGNOSIS — L89614 Pressure ulcer of right heel, stage 4: Secondary | ICD-10-CM | POA: Insufficient documentation

## 2016-01-24 DIAGNOSIS — E43 Unspecified severe protein-calorie malnutrition: Secondary | ICD-10-CM | POA: Diagnosis not present

## 2016-01-24 DIAGNOSIS — F0150 Vascular dementia without behavioral disturbance: Secondary | ICD-10-CM | POA: Diagnosis not present

## 2016-01-24 DIAGNOSIS — G8190 Hemiplegia, unspecified affecting unspecified side: Secondary | ICD-10-CM | POA: Diagnosis not present

## 2016-01-24 DIAGNOSIS — I771 Stricture of artery: Secondary | ICD-10-CM

## 2016-01-24 NOTE — Progress Notes (Signed)
[facility]  PCP: Olevia PerchesMegan Johnson, DO   Code Status: DNR  Goals of care: Advanced Directive information Advanced Directives 12/25/2015  Does Patient Have a Medical Advance Directive? Yes  Type of Advance Directive Out of facility DNR (pink MOST or yellow form)  Does patient want to make changes to medical advance directive? No - Patient declined  Copy of Healthcare Power of Attorney in Chart? Yes  Would patient like information on creating a medical advance directive? -  Pre-existing out of facility DNR order (yellow form or pink MOST form) -       Extended Emergency Contact Information Primary Emergency Contact: Sturdivant,Deloris Address: [address]          [place], [place] [address] [place] Home Phone: [phone] Mobile Phone: [phone] Relation: Daughter Secondary Emergency Contact: Ross,Jacqueline Address: [address]          [place], [place] [address] [place] Home Phone: [phone] Relation: Daughter   No Known Allergies  Chief Complaint  Patient presents with  . Medical Management of Chronic Issues    Routine Visit     HPI:  Patient is a 82 y.o. female seen today for routine visit. She has advanced dementia and does not participate in HPI and ROS. She has medical history of of CVA, seizure disorder, dementia, HTN, afib among others. Per nursing, no recent fall reported. Had falls last month. She is a fall risk. She gets wound care from treatment nurse. No acute behavioral change reported. She is under total care.   Review of Systems: Unable to obtain     Past Medical History:  Diagnosis Date  . Alzheimer disease   . Arthritis   . Frequent falls   . Hypertension   . Kidney disease   . Seizure [facility](HCC)    Past Surgical History:  Procedure Laterality Date  . TUBAL LIGATION       Medications:   Medication List       Accurate as of 01/24/16  2:56 PM. Always use your most recent med list.          alum & mag hydroxide-simeth 200-200-20 MG/5ML suspension Commonly known as:  MAALOX/MYLANTA Take 30 mLs by mouth daily as needed for indigestion or heartburn.   amLODipine 5 MG tablet Commonly known as:  NORVASC Take 5 mg by mouth daily.   bismuth subsalicylate 262 MG/15ML suspension Commonly known as:  PEPTO BISMOL Take 30 mLs by mouth every 6 (six) hours as needed (for GI upset).   clopidogrel 75 MG tablet Commonly known as:  PLAVIX Take 1 tablet (75 mg total) by mouth daily.   Cranberry Fruit 405 MG Caps TAKE 1 CAPSULE BY MOUTH TWICE DAILY TO PREVENT UTI   DECUBI-VITE Caps Take 1 capsule by mouth daily.   divalproex 125 MG capsule Commonly known as:  DEPAKOTE SPRINKLE Take 125 mg by mouth 2 (two) times daily.   donepezil 10 MG tablet Commonly known as:  ARICEPT TAKE 1 TABLET BY MOUTH ONCE DAILY FOR MEMORY   feeding supplement (PRO-STAT SUGAR FREE 64) Liqd Take 30 mLs by mouth daily.   GNP VITAMIN D SUPER STRENGTH 5000 units Tabs Generic drug:  Cholecalciferol TAKE 1 TABLET BY MOUTH ONCE DAILY FOR SUPPLEMENT   HYDROcodone-acetaminophen 5-325 MG tablet Commonly known as:  NORCO/VICODIN Take 1 tablet by mouth 2 (two) times daily. Also give 1 tablet by mouth one hour before dressing changes   latanoprost 0.005 % ophthalmic  solution Commonly known as:  XALATAN Place 1 drop into both eyes at bedtime.   loperamide 2 MG tablet Commonly known as:  IMODIUM A-D Take 2 mg by mouth 4 (four) times daily as needed for diarrhea or loose stools.   magnesium hydroxide 400 MG/5ML suspension Commonly known as:  MILK OF MAGNESIA Take 30 mLs by mouth daily as needed for mild constipation.   PROTEIN SUPPLEMENT 80% PO Take 90 mLs by mouth 2 (two) times daily. Medpass 2.0       Immunizations: Immunization History  Administered Date(s) Administered  . Influenza Whole 12/29/2013  . Influenza-Unspecified 11/27/2014  . PPD Test 09/14/2015, 09/28/2015  . Td 01/11/2015      Physical Exam:  Vitals:   01/24/16 1448  BP: 132/71  Pulse: 83  Resp: 18  Temp: 99.1 F (37.3 C)  TempSrc: Oral  SpO2: 98%  Weight: 87 lb 8 oz (39.7 kg)  Height: 5\' 1"  (1.549 m)   Body mass index is 16.53 kg/m.  General- elderly female, frail and thin built, in no acute distress Head- normocephalic, atraumatic Nose- no nasal discharge Throat- moist mucus membrane Eyes- no pallor, no icterus, no discharge Neck- no cervical lymphadenopathy Cardiovascular- normal s1,s2, no murmur Respiratory- bilateral clear to auscultation, no wheeze, no rhonchi, no crackles, no use of accessory muscles Abdomen- bowel sounds present, soft, non tender Musculoskeletal- flaccid right side with contracture to her knee and wrist, able to move her left side, has brace to right hand Neurological- has aphasia Skin- warm and dry, right lateral lower leg stage 4 PU, right great toe unstageable and right ankle stage 4 PU, right heel pressure ulcer, prevalon boot in place Psychiatry- normal mood and affect    Labs reviewed: Basic Metabolic Panel:  Recent Labs  62/13/0806/19/17 1849 08/19/15 0742 08/20/15 0308 09/18/15 09/25/15 10/23/15  NA 138 139 139 142 141 140  K 4.5 4.4 4.0 4.4 4.1 4.6  CL 100* 102 105  --   --   --   CO2 30 30 31   --   --   --   GLUCOSE 102* 110* 82  --   --   --   BUN 19 13 18 14 12 10   CREATININE 0.86 0.67 0.66 0.5 0.4* 0.3*  CALCIUM 9.4 9.0 8.7*  --   --   --    Liver Function Tests:  Recent Labs  05/21/15 1028 08/06/15 1849 09/18/15 09/25/15 10/23/15  AST 40 47* 54* 59* 36*  ALT 38* 37 75* 86* 43*  ALKPHOS 78 66 86 79 78  BILITOT <0.2 0.4  --   --   --   PROT 6.3 6.4*  --   --   --   ALBUMIN 3.3 2.9*  --   --   --    No results for input(s): LIPASE, AMYLASE in the last 8760 hours. No results for input(s): AMMONIA in the last 8760 hours. CBC:  Recent Labs  05/21/15 1028  08/06/15 1849 08/19/15 0742 08/20/15 0308 09/18/15  WBC 6.1  < > 6.5 8.0 5.8 5.5   NEUTROABS 3.8  --  4.0  --   --   --   HGB  --   < > 12.1 12.1 11.0* 13.0  HCT 37.1  < > 37.8 35.9 32.4* 41  MCV 89  --  88.7 86.5 86.2  --   PLT 283  < > 213 191 179 242  < > = values in this interval not displayed. Cardiac Enzymes:  Recent Labs  08/19/15 0742  TROPONINI <0.03   Lab Results  Component Value Date   HGBA1C 5.9 08/19/2015   Lipid Panel     Component Value Date/Time   CHOL 129 11/19/2015   CHOL 203 (H) 05/21/2015 1028   TRIG 61 11/19/2015   HDL 42 11/19/2015   HDL 71 05/21/2015 1028   CHOLHDL 3.3 08/21/2015 0423   VLDL 9 08/21/2015 0423   LDLCALC 75 11/19/2015   LDLCALC 115 (H) 05/21/2015 1028     Assessment/Plan  Severe protein calorie malnutrition Poor oral intake. Followed by RD. She has lost 12.1% weight in 90 days. she currently has multiple intervention in place with poor overall prognosis. under hospice service.   HTN Stable, on norvasc 5 mg daily, monitor  Hemiplegia post cva On right side. Currently on total care. Continue plavix. Continue norco for pain. She is OOB daily. Continue to wear her brace to prevent worsening of contracture.   Pressure ulcer Has multiple pressure ulcer and arterial ulcers with poor circulation. Poor prognosis with her dementia and malnutrition along with right sided hemiplegia. Has right lateral lower leg stage 4 PU, right great toe unstageable and right ankle stage 4 PU, right heel pressure ulcer, prevalon boot in place. Followed by treatment nurse. Has air mattress in place. Continue norco 5-325 mg bid for pain. Continue decubivite to promote wound healing.   Arterial ulcers With severe PAD. Has ulcers to her right leg and foot. Comfort care for now with pain meds as above.   Advanced dementia With her continuing to lose weight and her advanced dementia, discontinue ehr aricept.   Seizure disorder No further seizure reported. Continue depakote 250 mg bid   Labs/tests ordered: none  Family/ staff  Communication: reviewed care plan with patient and nursing supervisor    Oneal Grout, MD Internal Medicine [facility] [address] [place], [place] [address] Cell Phone (Monday-Friday 8 am - 5 pm): [phone] On Call: [phone] and follow prompts after 5 pm and on weekends Office Phone: [phone] Office Fax: [phone]

## 2016-01-28 ENCOUNTER — Other Ambulatory Visit: Payer: Self-pay | Admitting: *Deleted

## 2016-01-28 MED ORDER — HYDROCODONE-ACETAMINOPHEN 5-325 MG PO TABS
ORAL_TABLET | ORAL | 0 refills | Status: AC
Start: 2016-01-28 — End: ?

## 2016-01-28 NOTE — Telephone Encounter (Signed)
[facility] [phone] Fax: [phone]  

## 2016-02-18 DEATH — deceased

## 2017-03-08 IMAGING — CT CT HEAD W/O CM
1 series · 16 of 30 positions shown, 20 images · non-contrast
Comparison: CT scan 10/16/2014

CLINICAL DATA: Walked into a window today and hit head.

EXAM:
CT HEAD WITHOUT CONTRAST
TECHNIQUE: Contiguous axial images were obtained from the base of the skull
through the vertex without intravenous contrast.

[Series 2: head wo · axial · 0.47mm/px · z∈[-145,-19]mm · 16 of 32 slices shown, 20 images]
[im 2/32  brain]
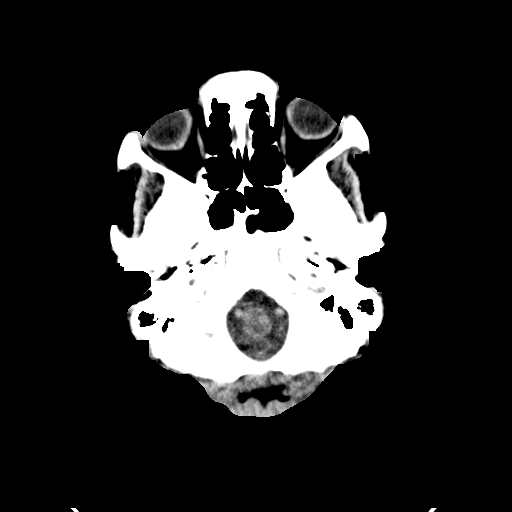
[im 2/32  bone]
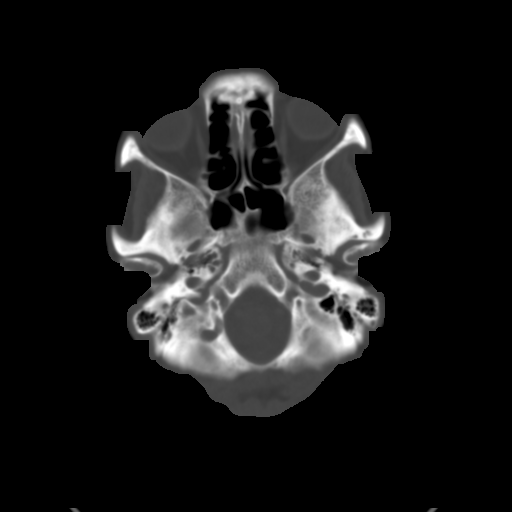
[im 4/32  brain]
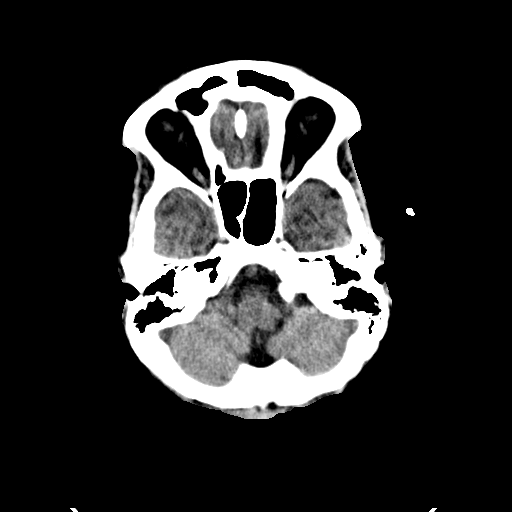
[im 6/32  brain]
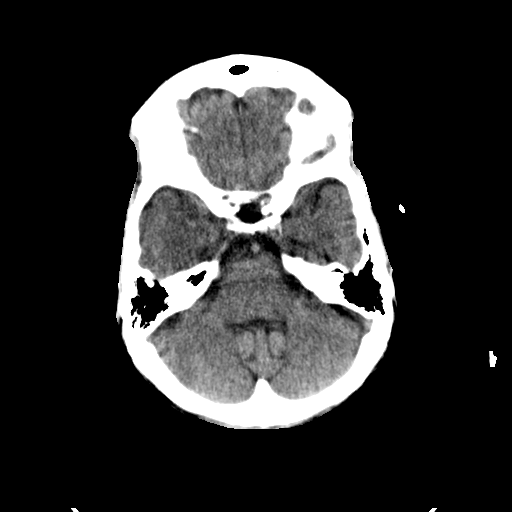
[im 8/32  brain]
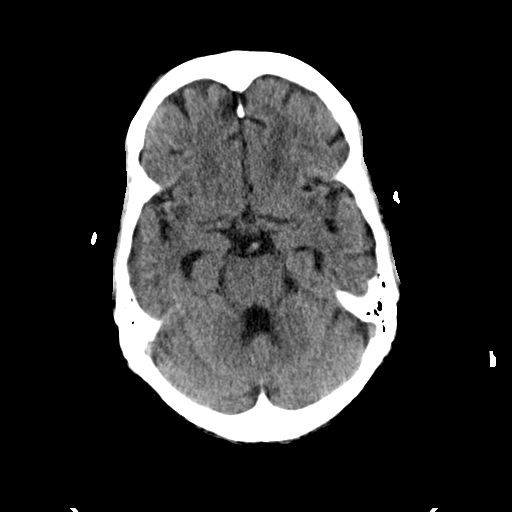
[im 9/32  brain]
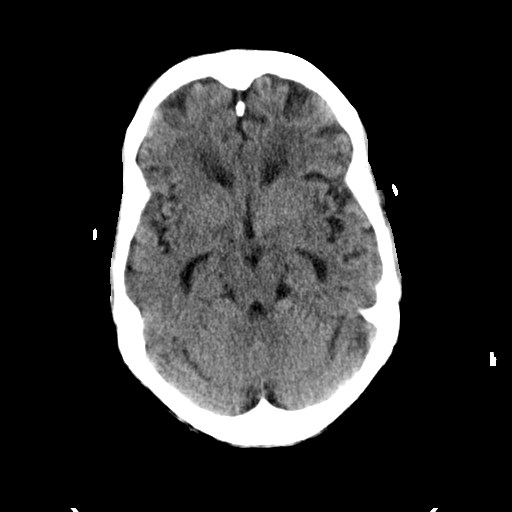
[im 9/32  bone]
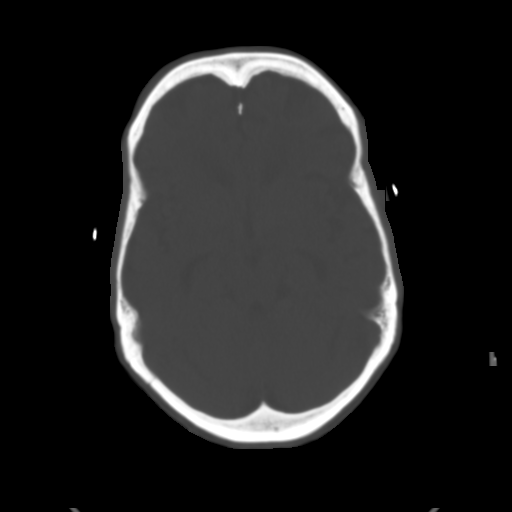
[im 11/32  brain]
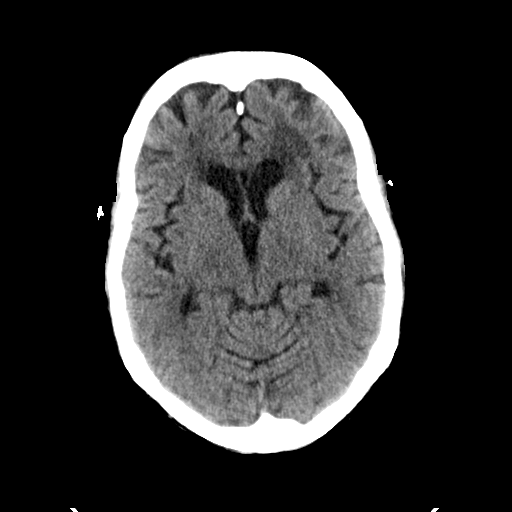
[im 13/32  brain]
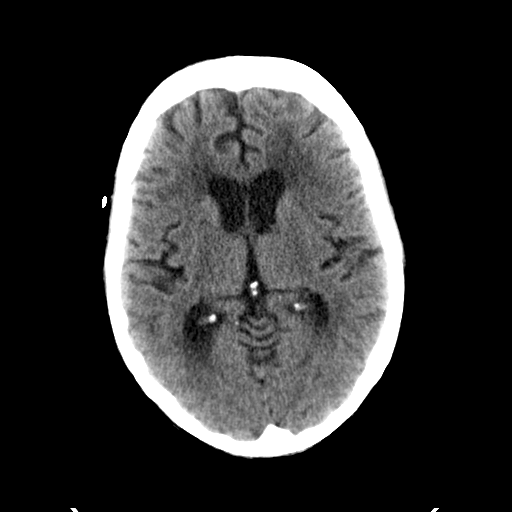
[im 15/32  brain]
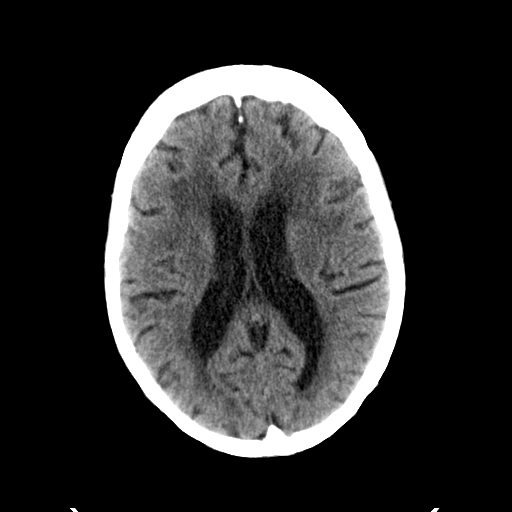
[im 17/32  brain]
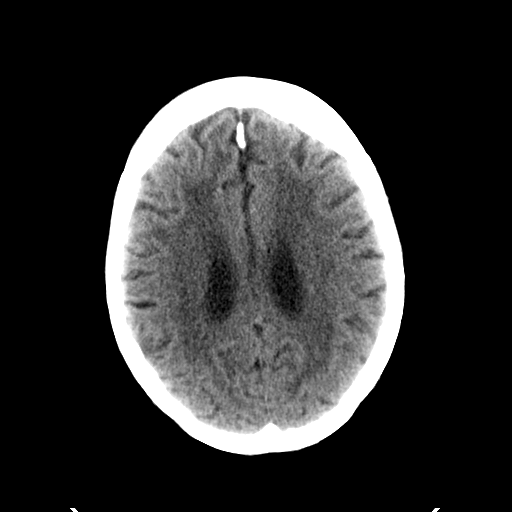
[im 17/32  bone]
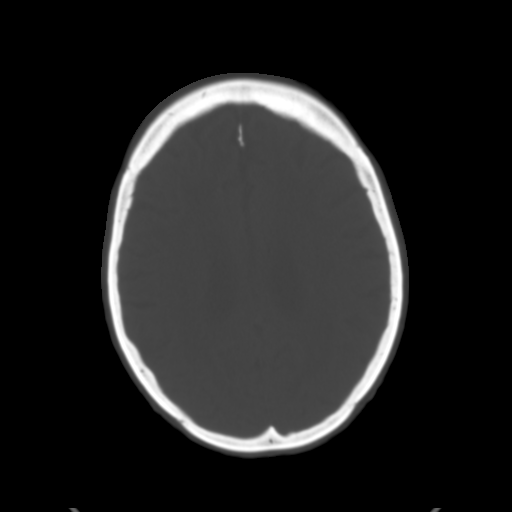
[im 19/32  brain]
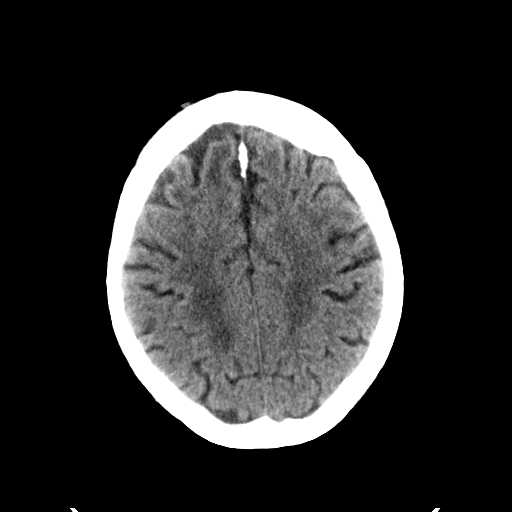
[im 21/32  brain]
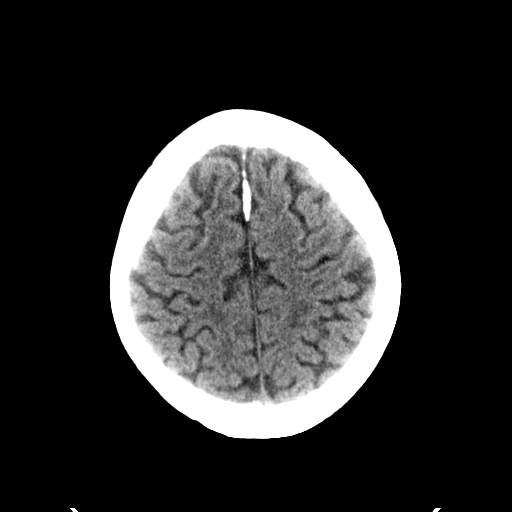
[im 23/32  brain]
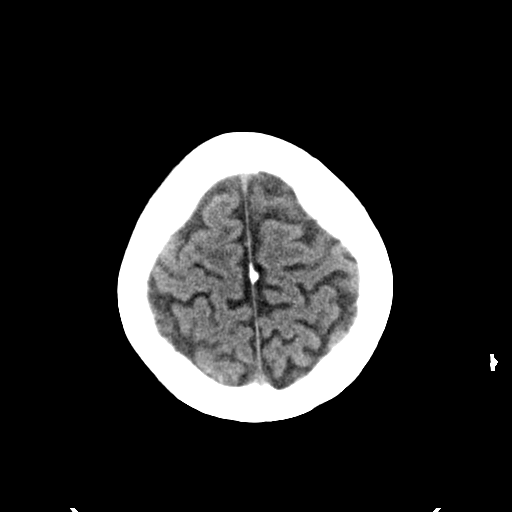
[im 24/32  brain]
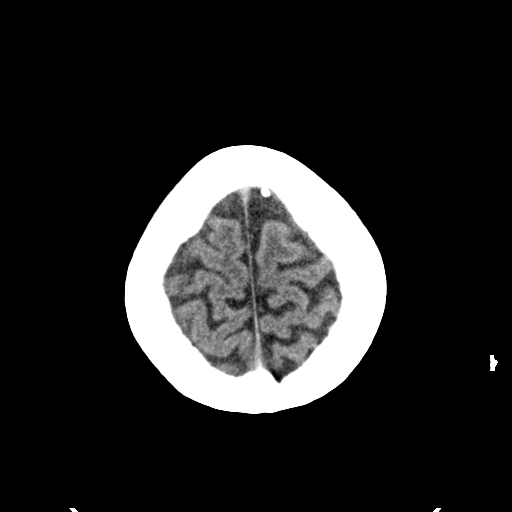
[im 24/32  bone]
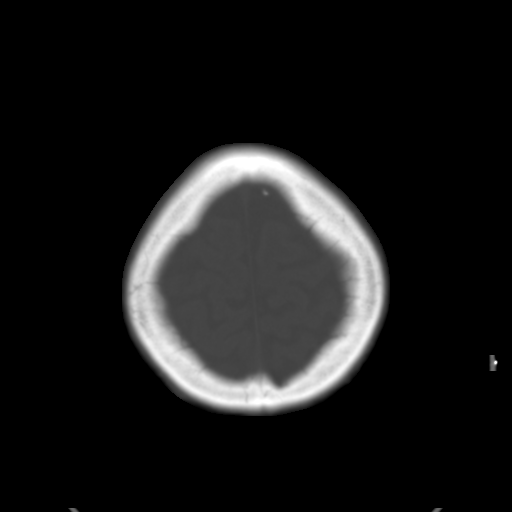
[im 26/32  brain]
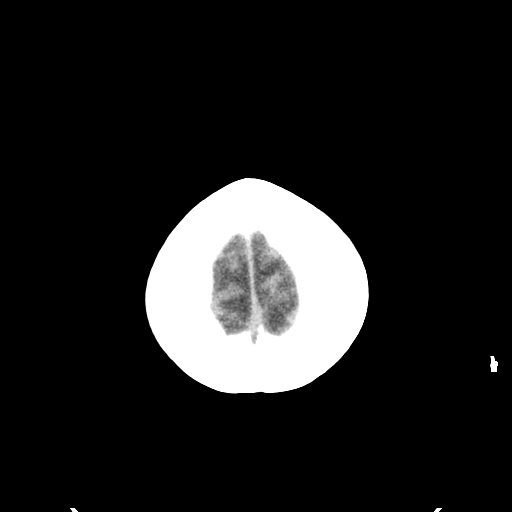
[im 28/32  brain]
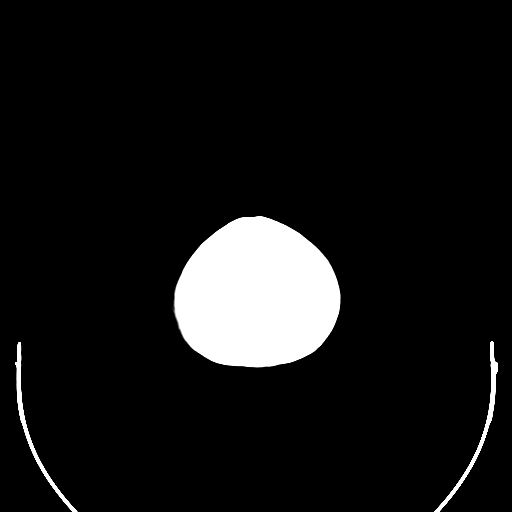
[im 30/32  brain]
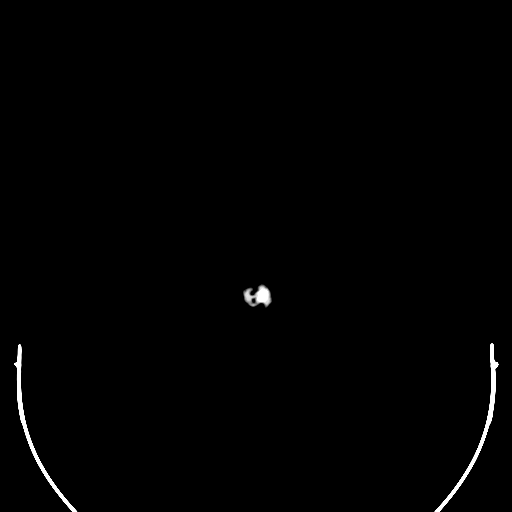

[16 of 30 positions shown; findings below may reference images not displayed]

FINDINGS: Stable age related cerebral atrophy, ventriculomegaly and
periventricular white matter disease. No extra-axial fluid
collections are identified. No CT findings for acute hemispheric
infarction or intracranial hemorrhage. No mass lesions. The
brainstem and cerebellum are normal.

No acute skull fracture is identified. The paranasal sinuses and
mastoid air cells are clear. The globes are intact. Stable vascular
calcifications.
IMPRESSION: Stable age related cerebral atrophy, ventriculomegaly and
periventricular white matter disease. No acute intracranial
findings.

No skull fracture.

## 2017-03-26 IMAGING — CT CT HEAD W/O CM
2 series · 14 of 30 positions shown, 16 images · non-contrast
Comparison: Head CT December 24, 2014 ; brain MRI October 16, 2014

CLINICAL DATA: Pain following fall

EXAM:
CT HEAD WITHOUT CONTRAST
TECHNIQUE: Contiguous axial images were obtained from the base of the skull
through the vertex without intravenous contrast.

[Series 2: head wo · axial · 0.42mm/px · z∈[-717,-618]mm · 6 of 32 slices shown, 8 images]
[im 5/32  brain]
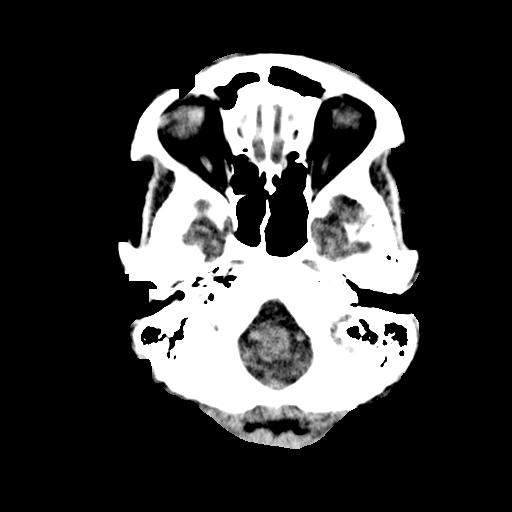
[im 5/32  bone]
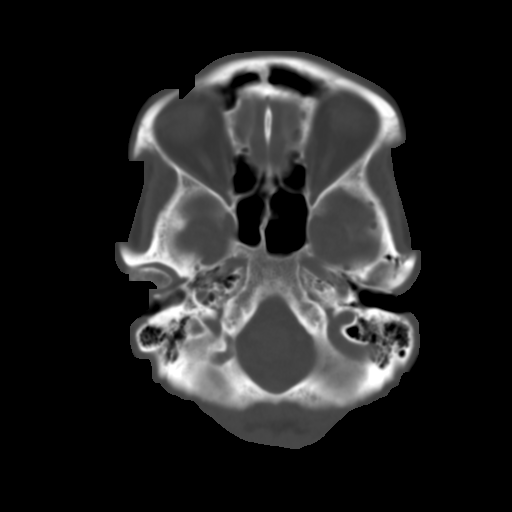
[im 9/32  brain]
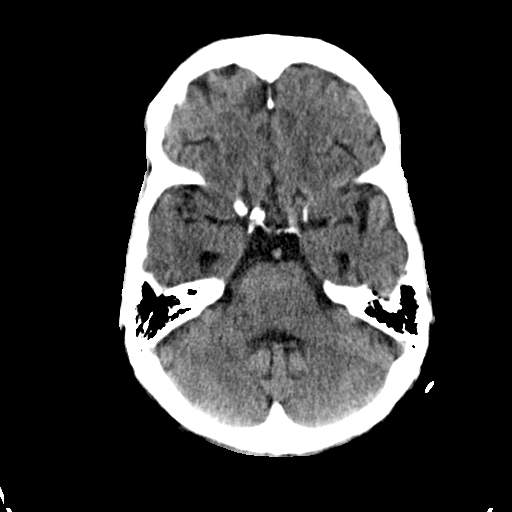
[im 14/32  brain]
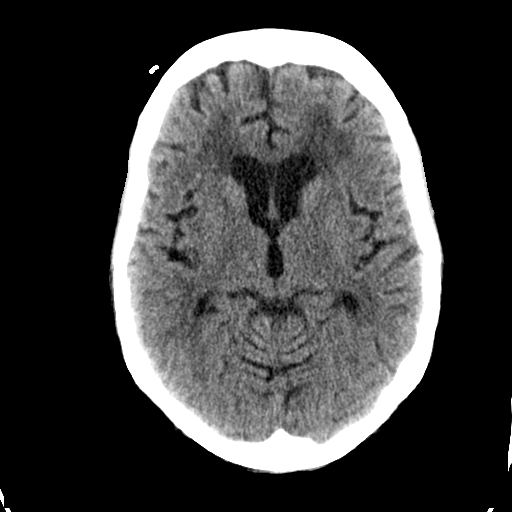
[im 18/32  brain]
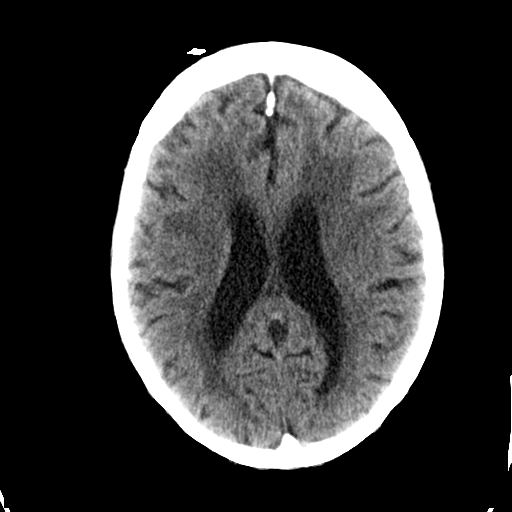
[im 23/32  brain]
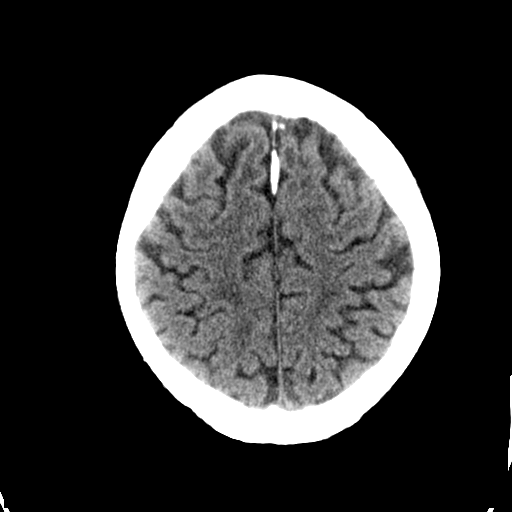
[im 23/32  bone]
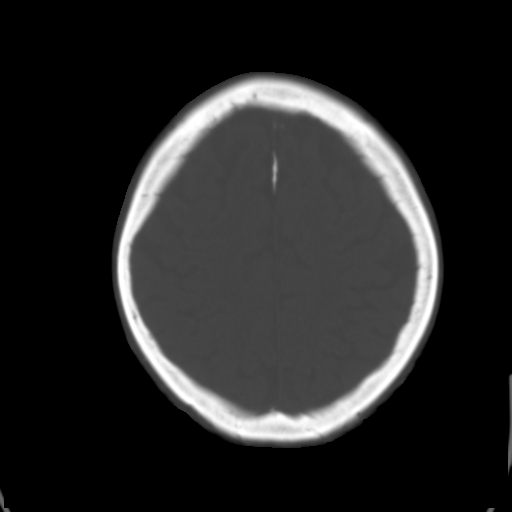
[im 27/32  brain]
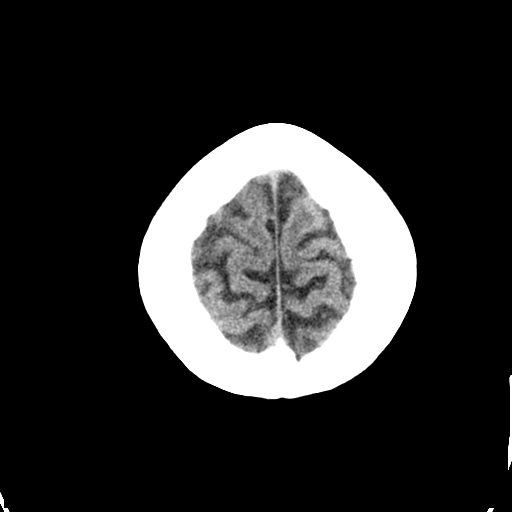

[Series 3: head bone · axial · 0.42mm/px · z∈[-723,-609]mm · 8 of 96 slices shown]
[im 10/96  bone]
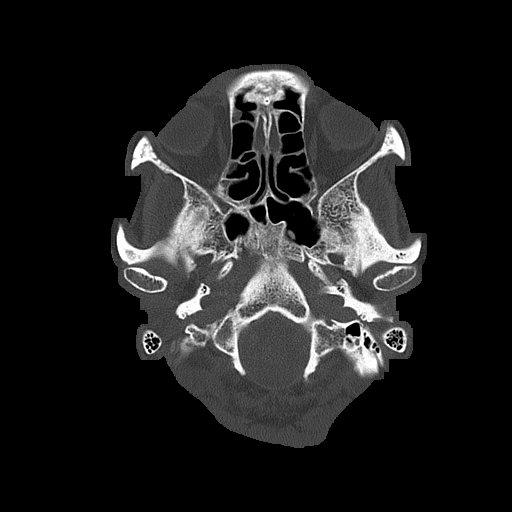
[im 19/96  bone]
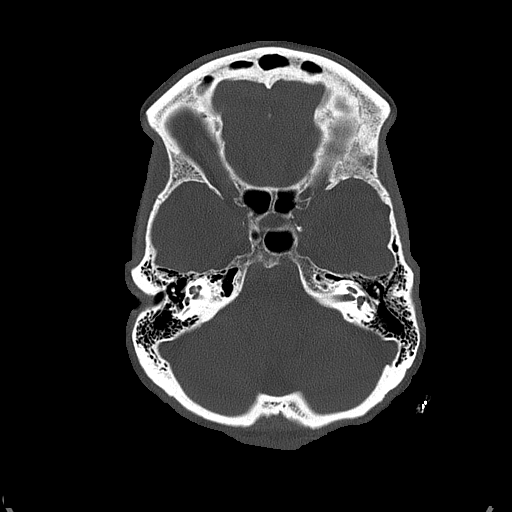
[im 32/96  bone]
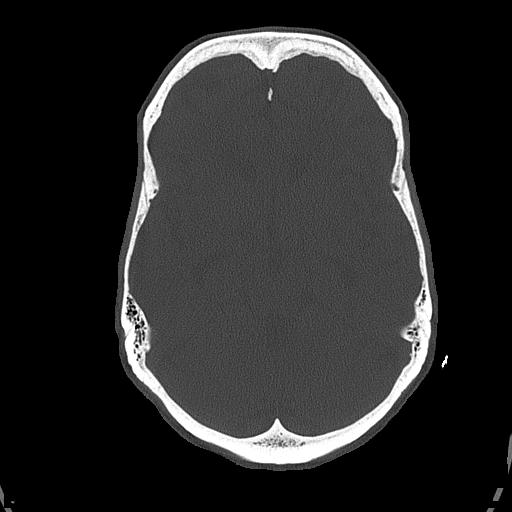
[im 41/96  bone]
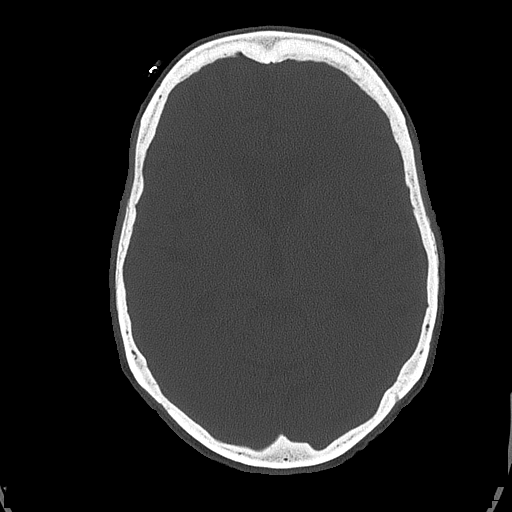
[im 55/96  bone]
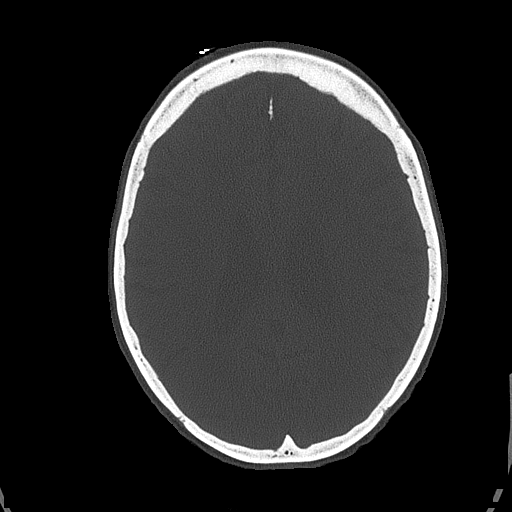
[im 64/96  bone]
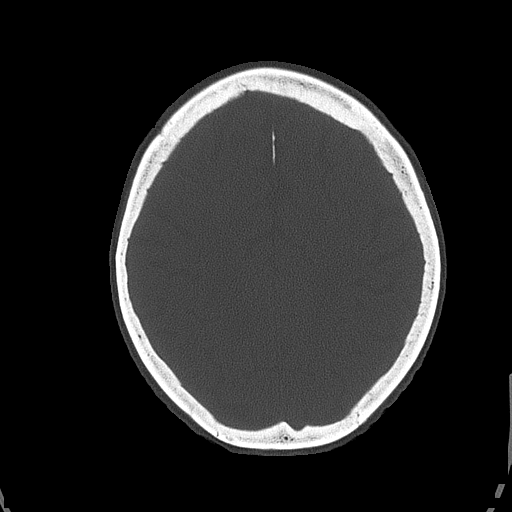
[im 77/96  bone]
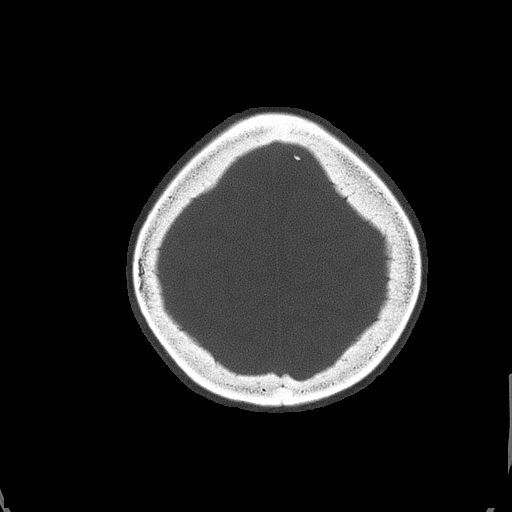
[im 86/96  bone]
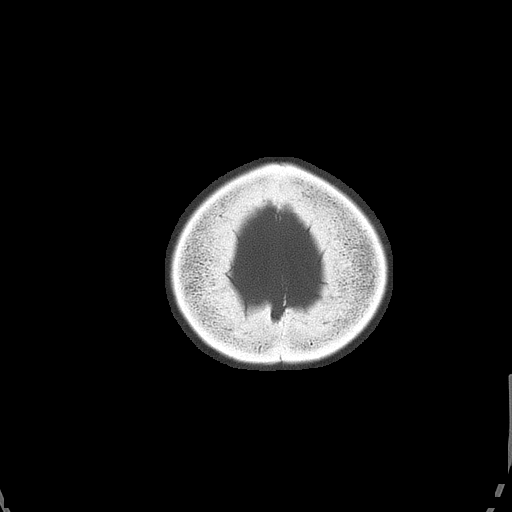

[14 of 30 positions shown; findings below may reference images not displayed]

FINDINGS: There is mild diffuse atrophy. There is no intracranial mass
hemorrhage, extra-axial fluid collection, or midline shift. There is
patchy small vessel disease in the centra semiovale bilaterally.
Elsewhere gray-white compartments appear normal. No acute infarct is
evident on this study.

There is a scalp hematoma over the inferior right frontal bone
extending to the medial preseptal orbital region. No intraorbital
lesion is appreciable. The bony calvarium appears intact. The
mastoid air cells are clear. There is mucosal thickening in several
ethmoid air cells bilaterally.
IMPRESSION: Atrophy with periventricular small vessel disease, stable. No
intracranial mass, hemorrhage, extra-axial fluid collection. No
acute infarct.

Right periorbital and inferior frontal scalp hematoma. No fracture
identified. Mucosal thickening is noted in several ethmoid air cells
bilaterally.
